# Patient Record
Sex: Male | Born: 1935 | Race: White | Hispanic: No | Smoking: Never smoker
Health system: Southern US, Community
[De-identification: ages and names within clinical notes are randomized; demographics above are authoritative.]

## PROBLEM LIST (undated history)

## (undated) DIAGNOSIS — C801 Malignant (primary) neoplasm, unspecified: Secondary | ICD-10-CM

## (undated) DIAGNOSIS — F329 Major depressive disorder, single episode, unspecified: Secondary | ICD-10-CM

## (undated) DIAGNOSIS — I2510 Atherosclerotic heart disease of native coronary artery without angina pectoris: Secondary | ICD-10-CM

## (undated) DIAGNOSIS — R110 Nausea: Secondary | ICD-10-CM

## (undated) DIAGNOSIS — N189 Chronic kidney disease, unspecified: Secondary | ICD-10-CM

## (undated) DIAGNOSIS — I639 Cerebral infarction, unspecified: Secondary | ICD-10-CM

## (undated) DIAGNOSIS — M1990 Unspecified osteoarthritis, unspecified site: Secondary | ICD-10-CM

## (undated) DIAGNOSIS — I10 Essential (primary) hypertension: Secondary | ICD-10-CM

## (undated) DIAGNOSIS — F32A Depression, unspecified: Secondary | ICD-10-CM

## (undated) HISTORY — PX: CARDIAC CATHETERIZATION: SHX172

## (undated) HISTORY — PX: TONSILLECTOMY: SUR1361

## (undated) HISTORY — PX: CYSTOSCOPY: SUR368

## (undated) HISTORY — PX: KNEE ARTHROSCOPY: SUR90

---

## 1959-07-12 HISTORY — PX: HYDROCELE EXCISION: SHX482

## 2008-11-10 DIAGNOSIS — I639 Cerebral infarction, unspecified: Secondary | ICD-10-CM

## 2008-11-10 HISTORY — DX: Cerebral infarction, unspecified: I63.9

## 2015-07-17 ENCOUNTER — Other Ambulatory Visit: Payer: Self-pay | Admitting: Physician Assistant

## 2015-07-17 NOTE — H&P (Signed)
TOTAL KNEE ADMISSION H&P  Patient is being admitted for left total knee arthroplasty.  Subjective:  Chief Complaint:left knee pain.  HPI: Craig Hoover, 81 y.o. male, has a history of pain and functional disability in the left knee due to arthritis and has failed non-surgical conservative treatments for greater than 12 weeks to includeNSAID's and/or analgesics, corticosteriod injections and viscosupplementation injections.  Onset of symptoms was gradual, starting 5 years ago with rapidlly worsening course since that time. The patient noted no past surgery on the left knee(s).  Patient currently rates pain in the left knee(s) at 6 out of 10 with activity. Patient has night pain, worsening of pain with activity and weight bearing, pain that interferes with activities of daily living and crepitus.  Patient has evidence of subchondral sclerosis and joint space narrowing by imaging studies. There is no active infection.  There are no active problems to display for this patient.  No past medical history on file.  No past surgical history on file.   (Not in a hospital admission) Allergies not on file  Social History  Substance Use Topics  . Smoking status: Not on file  . Smokeless tobacco: Not on file  . Alcohol Use: Not on file    No family history on file.   Review of Systems  Constitutional: Negative.   HENT: Negative.   Eyes: Negative.   Respiratory: Negative.   Cardiovascular: Negative.   Gastrointestinal: Negative.   Genitourinary: Negative.   Musculoskeletal: Positive for joint pain.  Skin: Negative.   Neurological: Negative.   Endo/Heme/Allergies: Negative.   Psychiatric/Behavioral: Negative.     Objective:  Physical Exam  Constitutional: He is oriented to person, place, and time. He appears well-developed and well-nourished.  HENT:  Head: Normocephalic and atraumatic.  Eyes: EOM are normal. Pupils are equal, round, and reactive to light.  Neck: Normal range of motion.  Neck supple.  Cardiovascular: Normal rate and regular rhythm.   Respiratory: Effort normal and breath sounds normal.  GI: Soft. Bowel sounds are normal.  Musculoskeletal:  Alert and oriented x 3.  Examination of his left knee reveals range of motion from 2-90 degrees.  Marked patellofemoral crepitus.  No joint line tenderness.  Examination of his right knee reveals range of motion from 0-120 degrees.  Marked patellofemoral crepitus.  Lateral joint line tenderness to palpation.  He is neurovascularly intact distally.    Neurological: He is alert and oriented to person, place, and time.  Skin: Skin is warm and dry.  Psychiatric: He has a normal mood and affect. His behavior is normal. Judgment and thought content normal.    Vital signs in last 24 hours: @VSRANGES @  Labs:   There is no height or weight on file to calculate BMI.   Imaging Review Plain radiographs demonstrate severe degenerative joint disease of the left knee(s). The overall alignment ismild varus. The bone quality appears to be fair for age and reported activity level.  Assessment/Plan:  End stage arthritis, left knee   The patient history, physical examination, clinical judgment of the provider and imaging studies are consistent with end stage degenerative joint disease of the left knee(s) and total knee arthroplasty is deemed medically necessary. The treatment options including medical management, injection therapy arthroscopy and arthroplasty were discussed at length. The risks and benefits of total knee arthroplasty were presented and reviewed. The risks due to aseptic loosening, infection, stiffness, patella tracking problems, thromboembolic complications and other imponderables were discussed. The patient acknowledged the explanation, agreed to  proceed with the plan and consent was signed. Patient is being admitted for inpatient treatment for surgery, pain control, PT, OT, prophylactic antibiotics, VTE prophylaxis,  progressive ambulation and ADL's and discharge planning. The patient is planning to be discharged home with home health services

## 2015-07-20 ENCOUNTER — Encounter (HOSPITAL_COMMUNITY): Payer: Self-pay

## 2015-07-20 ENCOUNTER — Encounter (HOSPITAL_COMMUNITY)
Admission: RE | Admit: 2015-07-20 | Discharge: 2015-07-20 | Disposition: A | Discharge status: Home / Self Care | Payer: BLUE CROSS/BLUE SHIELD | Source: Ambulatory Visit | Attending: Orthopedic Surgery | Admitting: Orthopedic Surgery

## 2015-07-20 DIAGNOSIS — Z01812 Encounter for preprocedural laboratory examination: Secondary | ICD-10-CM | POA: Diagnosis not present

## 2015-07-20 DIAGNOSIS — M1712 Unilateral primary osteoarthritis, left knee: Secondary | ICD-10-CM | POA: Insufficient documentation

## 2015-07-20 DIAGNOSIS — Z0183 Encounter for blood typing: Secondary | ICD-10-CM | POA: Insufficient documentation

## 2015-07-20 HISTORY — DX: Chronic kidney disease, unspecified: N18.9

## 2015-07-20 HISTORY — DX: Atherosclerotic heart disease of native coronary artery without angina pectoris: I25.10

## 2015-07-20 HISTORY — DX: Unspecified osteoarthritis, unspecified site: M19.90

## 2015-07-20 HISTORY — DX: Nausea: R11.0

## 2015-07-20 HISTORY — DX: Cerebral infarction, unspecified: I63.9

## 2015-07-20 HISTORY — DX: Depression, unspecified: F32.A

## 2015-07-20 HISTORY — DX: Major depressive disorder, single episode, unspecified: F32.9

## 2015-07-20 HISTORY — DX: Essential (primary) hypertension: I10

## 2015-07-20 HISTORY — DX: Malignant (primary) neoplasm, unspecified: C80.1

## 2015-07-20 LAB — COMPREHENSIVE METABOLIC PANEL
ALT: 15 U/L — ABNORMAL LOW (ref 17–63)
AST: 29 U/L (ref 15–41)
Albumin: 3.9 g/dL (ref 3.5–5.0)
Alkaline Phosphatase: 93 U/L (ref 38–126)
Anion gap: 7 (ref 5–15)
BILIRUBIN TOTAL: 0.9 mg/dL (ref 0.3–1.2)
BUN: 15 mg/dL (ref 6–20)
CO2: 27 mmol/L (ref 22–32)
Calcium: 9 mg/dL (ref 8.9–10.3)
Chloride: 102 mmol/L (ref 101–111)
Creatinine, Ser: 0.85 mg/dL (ref 0.61–1.24)
Glucose, Bld: 96 mg/dL (ref 65–99)
POTASSIUM: 4.1 mmol/L (ref 3.5–5.1)
Sodium: 136 mmol/L (ref 135–145)
TOTAL PROTEIN: 6.7 g/dL (ref 6.5–8.1)

## 2015-07-20 LAB — CBC WITH DIFFERENTIAL/PLATELET
BASOS ABS: 0 10*3/uL (ref 0.0–0.1)
Basophils Relative: 1 % (ref 0–1)
Eosinophils Absolute: 0.2 10*3/uL (ref 0.0–0.7)
Eosinophils Relative: 3 % (ref 0–5)
HEMATOCRIT: 41 % (ref 39.0–52.0)
Hemoglobin: 13.3 g/dL (ref 13.0–17.0)
LYMPHS ABS: 1.4 10*3/uL (ref 0.7–4.0)
LYMPHS PCT: 21 % (ref 12–46)
MCH: 28.9 pg (ref 26.0–34.0)
MCHC: 32.4 g/dL (ref 30.0–36.0)
MCV: 88.9 fL (ref 78.0–100.0)
MONO ABS: 0.4 10*3/uL (ref 0.1–1.0)
MONOS PCT: 6 % (ref 3–12)
NEUTROS ABS: 4.4 10*3/uL (ref 1.7–7.7)
Neutrophils Relative %: 69 % (ref 43–77)
Platelets: 259 10*3/uL (ref 150–400)
RBC: 4.61 MIL/uL (ref 4.22–5.81)
RDW: 13.7 % (ref 11.5–15.5)
WBC: 6.3 10*3/uL (ref 4.0–10.5)

## 2015-07-20 LAB — TYPE AND SCREEN
ABO/RH(D): O POS
ANTIBODY SCREEN: NEGATIVE

## 2015-07-20 LAB — APTT: aPTT: 31 seconds (ref 24–37)

## 2015-07-20 LAB — SURGICAL PCR SCREEN
MRSA, PCR: NEGATIVE
Staphylococcus aureus: NEGATIVE

## 2015-07-20 LAB — PROTIME-INR
INR: 1.02 (ref 0.00–1.49)
PROTHROMBIN TIME: 13.6 s (ref 11.6–15.2)

## 2015-07-20 LAB — ABO/RH: ABO/RH(D): O POS

## 2015-07-20 NOTE — Progress Notes (Signed)
Via fax requested records from Dr.J. Kathlen Mody, hoping to get recent EKG.

## 2015-07-20 NOTE — Pre-Procedure Instructions (Signed)
Craig Hoover  07/20/2015      [facility] [address], [place] - [place], [id number] [place] [address] Phone: [phone] Fax: [phone]    Your procedure is scheduled on 08/01/2015   Report to [facility] Admitting at 9:30 A.M.  Call this number if you have problems the morning of surgery:  [phone]   Remember:  Do not eat food or drink liquids after midnight.  On Tuesday   Take these medicines the morning of surgery with A SIP OF WATER: PAROXETINE   Do not wear jewelry   Do not wear lotions, powders, or perfumes.  You may wear deodorant.    Men may shave face and neck.   Do not bring valuables to the hospital.   [facility] is not responsible for any belongings or valuables.  Contacts, dentures or bridgework may not be worn into surgery.  Leave your suitcase in the car.  After surgery it may be brought to your room.  For patients admitted to the hospital, discharge time will be determined by your treatment team.  Patients discharged the day of surgery will not be allowed to drive home.   Name and phone number of your driver:   With daughter Special instructions:  Special Instructions: [place] - Preparing for Surgery  Before surgery, you can play an important role.  Because skin is not sterile, your skin needs to be as free of germs as possible.  You can reduce the number of germs on you skin by washing with CHG (chlorahexidine gluconate) soap before surgery.  CHG is an antiseptic cleaner which kills germs and bonds with the skin to continue killing germs even after washing.  Please DO NOT use if you have an allergy to CHG or antibacterial soaps.  If your skin becomes reddened/irritated stop using the CHG and inform your nurse when you arrive at Short Stay.  Do not shave (including legs and underarms) for at least 48 hours prior to the first CHG shower.  You may shave your face.  Please follow  these instructions carefully:   1.  Shower with CHG Soap the night before surgery and the  morning of Surgery.  2.  If you choose to wash your hair, wash your hair first as usual with your  normal shampoo.  3.  After you shampoo, rinse your hair and body thoroughly to remove the  Shampoo.  4.  Use CHG as you would any other liquid soap.  You can apply chg directly to the skin and wash gently with scrungie or a clean washcloth.  5.  Apply the CHG Soap to your body ONLY FROM THE NECK DOWN.    Do not use on open wounds or open sores.  Avoid contact with your eyes, ears, mouth and genitals (private parts).  Wash genitals (private parts)   with your normal soap.  6.  Wash thoroughly, paying special attention to the area where your surgery will be performed.  7.  Thoroughly rinse your body with warm water from the neck down.  8.  DO NOT shower/wash with your normal soap after using and rinsing off   the CHG Soap.  9.  Pat yourself dry with a clean towel.            10.  Wear clean pajamas.            11.  Place clean sheets on your bed the  night of your first shower and do not sleep with pets.  Day of Surgery  Do not apply any lotions/deodorants the morning of surgery.  Please wear clean clothes to the hospital/surgery center.  Please read over the following fact sheets that you were given. Pain Booklet, Coughing and Deep Breathing, Blood Transfusion Information, Total Joint Packet, MRSA Information and Surgical Site Infection Prevention

## 2015-07-20 NOTE — Progress Notes (Signed)
Call to Jay, Sherian Rein. Tech. Informed that some additions were made to med. List & pt. Had not reported Paroxetine because " didn't think it was important".  Daughter of pt. Is an Therapist, sports, will call back to pharm. On 07/23/2015 if there are anymore additions to be made.

## 2015-07-20 NOTE — Progress Notes (Signed)
Requesting records from Dr. Donnetta Hutching in [facility], stress test & EKG & clearance done recently.

## 2015-07-20 NOTE — Progress Notes (Signed)
Call to Dr. Keturah Barre. Murphy's office, spoke with Benjie Karvonen relative to pt.'s report of allergy to PCN, she will leave msg. For MD/PA to review the preop antibiotic.

## 2015-07-20 NOTE — Progress Notes (Signed)
   07/20/15 1302  OBSTRUCTIVE SLEEP APNEA  Have you ever been diagnosed with sleep apnea through a sleep study? No  Do you snore loudly (loud enough to be heard through closed doors)?  0  Do you often feel tired, fatigued, or sleepy during the daytime (such as falling asleep during driving or talking to someone)? 0  Has anyone observed you stop breathing during your sleep? 0  Do you have, or are you being treated for high blood pressure? 1  BMI more than 35 kg/m2? 0  Age > 50 (1-yes) 1  Neck circumference greater than:Male 16 inches or larger, Male 17inches or larger? 1  Male Gender (Yes=1) 1  Obstructive Sleep Apnea Score 4  Score 5 or greater  Results sent to PCP

## 2015-07-21 LAB — URINE CULTURE: Culture: NO GROWTH

## 2015-07-23 NOTE — Progress Notes (Signed)
Spoke with Carlynn Purl, PA at Dr. Debroah Loop office, states she is aware of allergies but Ancef 2g is okay to give.

## 2015-07-25 ENCOUNTER — Encounter (HOSPITAL_COMMUNITY): Payer: Self-pay

## 2015-07-25 NOTE — Progress Notes (Signed)
Anesthesia Chart Review:  Pt is 81 year old male scheduled for L total knee arthroplasty on 08/01/2015 with Dr. Maryla Morrow.   PCP is Dr. Gwenyth Allegra in [facility]. Cardiologist is Dr. Ricky Ala in [facility].   PMH includes: CAD (by notes, stent to LAD in 2009), stroke (2010), HTN, melanoma. Smoking status not assessed (by notes, former smoker). BMI 28.   Medications include: ASA, lipitor, metoprolol, zantac.   Preoperative labs reviewed.    Chest x-ray report 06/25/2015 reviewed. No active disease.   EKG 06/22/2015: sinus rhythm with rate variation. LAFB.   Nuclear stress test 04/17/2015: There is a fixed defect in the inferior wall, probably due to soft tissue attenuation although cannot exclude prior infarction. No ischemia noted. EF 68%.   Echo 08/24/2010: -Mild concentric LVH -EF 65-70% -No significant valvular disease.   Pt has medical clearance from Dr. Kathlen Mody. Stress test was done by cardiology in anticipation of upcoming surgery.   If no changes, I anticipate pt can proceed with surgery as scheduled.   Willeen Cass, FNP-BC [facility]/Anesthesiology Phone: [phone] 07/25/2015 1:57 PM

## 2015-07-31 MED ORDER — CEFAZOLIN SODIUM-DEXTROSE 2-3 GM-% IV SOLR: 2.0000 g | INTRAVENOUS | Status: DC

## 2015-07-31 MED ORDER — VANCOMYCIN HCL IN DEXTROSE 1-5 GM/200ML-% IV SOLN
1000.0000 mg | INTRAVENOUS | Status: AC
  Administered 2015-08-01: 1000 mg via INTRAVENOUS
  Filled 2015-07-31: qty 200

## 2015-08-01 ENCOUNTER — Encounter (HOSPITAL_COMMUNITY): Payer: Self-pay | Admitting: General Practice

## 2015-08-01 ENCOUNTER — Inpatient Hospital Stay (HOSPITAL_COMMUNITY): Payer: BLUE CROSS/BLUE SHIELD

## 2015-08-01 ENCOUNTER — Inpatient Hospital Stay (HOSPITAL_COMMUNITY)
Admission: RE | Admit: 2015-08-01 | Discharge: 2015-08-03 | DRG: 470 | Disposition: A | Discharge status: Home Health | Payer: BLUE CROSS/BLUE SHIELD | Source: Ambulatory Visit | Attending: Orthopedic Surgery | Admitting: Orthopedic Surgery

## 2015-08-01 ENCOUNTER — Inpatient Hospital Stay (HOSPITAL_COMMUNITY): Payer: BLUE CROSS/BLUE SHIELD | Admitting: Emergency Medicine

## 2015-08-01 ENCOUNTER — Inpatient Hospital Stay (HOSPITAL_COMMUNITY): Payer: BLUE CROSS/BLUE SHIELD | Admitting: Anesthesiology

## 2015-08-01 ENCOUNTER — Encounter (HOSPITAL_COMMUNITY)
Admission: RE | Disposition: A | Discharge status: Home Health | Payer: Self-pay | Source: Ambulatory Visit | Attending: Orthopedic Surgery

## 2015-08-01 DIAGNOSIS — Z8673 Personal history of transient ischemic attack (TIA), and cerebral infarction without residual deficits: Secondary | ICD-10-CM

## 2015-08-01 DIAGNOSIS — I2510 Atherosclerotic heart disease of native coronary artery without angina pectoris: Secondary | ICD-10-CM | POA: Diagnosis present

## 2015-08-01 DIAGNOSIS — M1710 Unilateral primary osteoarthritis, unspecified knee: Secondary | ICD-10-CM | POA: Diagnosis present

## 2015-08-01 DIAGNOSIS — I10 Essential (primary) hypertension: Secondary | ICD-10-CM | POA: Diagnosis present

## 2015-08-01 DIAGNOSIS — D62 Acute posthemorrhagic anemia: Secondary | ICD-10-CM | POA: Diagnosis not present

## 2015-08-01 DIAGNOSIS — F329 Major depressive disorder, single episode, unspecified: Secondary | ICD-10-CM | POA: Diagnosis present

## 2015-08-01 DIAGNOSIS — M25562 Pain in left knee: Secondary | ICD-10-CM | POA: Diagnosis present

## 2015-08-01 DIAGNOSIS — M1712 Unilateral primary osteoarthritis, left knee: Principal | ICD-10-CM | POA: Diagnosis present

## 2015-08-01 DIAGNOSIS — R110 Nausea: Secondary | ICD-10-CM | POA: Diagnosis not present

## 2015-08-01 DIAGNOSIS — M179 Osteoarthritis of knee, unspecified: Secondary | ICD-10-CM | POA: Diagnosis present

## 2015-08-01 DIAGNOSIS — Z96659 Presence of unspecified artificial knee joint: Secondary | ICD-10-CM

## 2015-08-01 HISTORY — PX: TOTAL KNEE ARTHROPLASTY: SHX125

## 2015-08-01 SURGERY — ARTHROPLASTY, KNEE, TOTAL
Anesthesia: General | Laterality: Left

## 2015-08-01 MED ORDER — ZOLPIDEM TARTRATE 5 MG PO TABS: 5.0000 mg | ORAL_TABLET | Freq: Every evening | ORAL | Status: DC | PRN

## 2015-08-01 MED ORDER — BUPIVACAINE LIPOSOME 1.3 % IJ SUSP
20.0000 mL | INTRAMUSCULAR | Status: DC
  Filled 2015-08-01: qty 20

## 2015-08-01 MED ORDER — ONDANSETRON HCL 4 MG/2ML IJ SOLN
4.0000 mg | Freq: Four times a day (QID) | INTRAMUSCULAR | Status: DC | PRN
Start: 2015-08-01 — End: 2015-08-03
  Administered 2015-08-01: 4 mg via INTRAVENOUS
  Filled 2015-08-01: qty 2

## 2015-08-01 MED ORDER — ROCURONIUM BROMIDE 50 MG/5ML IV SOLN
INTRAVENOUS | Status: AC
  Filled 2015-08-01: qty 1

## 2015-08-01 MED ORDER — BUPIVACAINE HCL 0.5 % IJ SOLN
INTRAMUSCULAR | Status: DC | PRN
  Administered 2015-08-01: 10 mL

## 2015-08-01 MED ORDER — APIXABAN 2.5 MG PO TABS
2.5000 mg | ORAL_TABLET | Freq: Two times a day (BID) | ORAL | Status: DC
  Administered 2015-08-02 – 2015-08-03 (×3): 2.5 mg via ORAL
  Filled 2015-08-01 (×3): qty 1

## 2015-08-01 MED ORDER — POTASSIUM CHLORIDE IN NACL 20-0.9 MEQ/L-% IV SOLN
INTRAVENOUS | Status: DC
  Administered 2015-08-01: 16:00:00 via INTRAVENOUS
  Filled 2015-08-01 (×2): qty 1000

## 2015-08-01 MED ORDER — DEXAMETHASONE SODIUM PHOSPHATE 10 MG/ML IJ SOLN
10.0000 mg | Freq: Once | INTRAMUSCULAR | Status: AC
  Administered 2015-08-02: 10 mg via INTRAVENOUS
  Filled 2015-08-01: qty 1

## 2015-08-01 MED ORDER — CELECOXIB 200 MG PO CAPS
200.0000 mg | ORAL_CAPSULE | Freq: Two times a day (BID) | ORAL | Status: DC
  Administered 2015-08-02 – 2015-08-03 (×2): 200 mg via ORAL
  Filled 2015-08-01 (×5): qty 1

## 2015-08-01 MED ORDER — ATORVASTATIN CALCIUM 40 MG PO TABS
40.0000 mg | ORAL_TABLET | Freq: Every day | ORAL | Status: DC
  Administered 2015-08-01 – 2015-08-02 (×2): 40 mg via ORAL
  Filled 2015-08-01 (×2): qty 1

## 2015-08-01 MED ORDER — METHOCARBAMOL 1000 MG/10ML IJ SOLN
500.0000 mg | Freq: Four times a day (QID) | INTRAMUSCULAR | Status: DC | PRN
  Filled 2015-08-01: qty 5

## 2015-08-01 MED ORDER — FENTANYL CITRATE (PF) 250 MCG/5ML IJ SOLN
INTRAMUSCULAR | Status: AC
  Filled 2015-08-01: qty 5

## 2015-08-01 MED ORDER — FINASTERIDE 5 MG PO TABS
5.0000 mg | ORAL_TABLET | Freq: Every day | ORAL | Status: DC
  Administered 2015-08-01 – 2015-08-02 (×2): 5 mg via ORAL
  Filled 2015-08-01 (×4): qty 1

## 2015-08-01 MED ORDER — METOPROLOL SUCCINATE ER 25 MG PO TB24
25.0000 mg | ORAL_TABLET | Freq: Every day | ORAL | Status: DC
  Administered 2015-08-01 – 2015-08-02 (×2): 25 mg via ORAL
  Filled 2015-08-01 (×2): qty 1

## 2015-08-01 MED ORDER — CHLORHEXIDINE GLUCONATE 4 % EX LIQD: 60.0000 mL | Freq: Once | CUTANEOUS | Status: DC

## 2015-08-01 MED ORDER — BUPIVACAINE LIPOSOME 1.3 % IJ SUSP
INTRAMUSCULAR | Status: DC | PRN
  Administered 2015-08-01: 20 mL

## 2015-08-01 MED ORDER — DIPHENHYDRAMINE HCL 12.5 MG/5ML PO ELIX: 12.5000 mg | ORAL_SOLUTION | ORAL | Status: DC | PRN

## 2015-08-01 MED ORDER — LIDOCAINE HCL (CARDIAC) 20 MG/ML IV SOLN
INTRAVENOUS | Status: DC | PRN
  Administered 2015-08-01: 60 mg via INTRAVENOUS

## 2015-08-01 MED ORDER — PAROXETINE HCL 20 MG PO TABS
20.0000 mg | ORAL_TABLET | Freq: Every day | ORAL | Status: DC
  Administered 2015-08-02 – 2015-08-03 (×2): 20 mg via ORAL
  Filled 2015-08-01 (×2): qty 1

## 2015-08-01 MED ORDER — LIDOCAINE HCL (CARDIAC) 20 MG/ML IV SOLN
INTRAVENOUS | Status: AC
  Filled 2015-08-01: qty 5

## 2015-08-01 MED ORDER — HYDROMORPHONE HCL 2 MG PO TABS
2.0000 mg | ORAL_TABLET | ORAL | Status: DC | PRN
  Administered 2015-08-01 – 2015-08-03 (×8): 2 mg via ORAL
  Filled 2015-08-01 (×8): qty 1

## 2015-08-01 MED ORDER — BISACODYL 5 MG PO TBEC: 5.0000 mg | DELAYED_RELEASE_TABLET | Freq: Every day | ORAL | Status: DC | PRN

## 2015-08-01 MED ORDER — HYDROMORPHONE HCL 1 MG/ML IJ SOLN
INTRAMUSCULAR | Status: AC
  Filled 2015-08-01: qty 1

## 2015-08-01 MED ORDER — VANCOMYCIN HCL IN DEXTROSE 1-5 GM/200ML-% IV SOLN
1000.0000 mg | Freq: Two times a day (BID) | INTRAVENOUS | Status: AC
  Administered 2015-08-01: 1000 mg via INTRAVENOUS
  Filled 2015-08-01: qty 200

## 2015-08-01 MED ORDER — BUPIVACAINE HCL (PF) 0.5 % IJ SOLN
INTRAMUSCULAR | Status: AC
  Filled 2015-08-01: qty 10

## 2015-08-01 MED ORDER — MENTHOL 3 MG MT LOZG: 1.0000 | LOZENGE | OROMUCOSAL | Status: DC | PRN

## 2015-08-01 MED ORDER — ONDANSETRON HCL 4 MG/2ML IJ SOLN
INTRAMUSCULAR | Status: AC
  Filled 2015-08-01: qty 4

## 2015-08-01 MED ORDER — APIXABAN 2.5 MG PO TABS: ORAL_TABLET | ORAL | Status: AC

## 2015-08-01 MED ORDER — LACTATED RINGERS IV SOLN
INTRAVENOUS | Status: DC
  Administered 2015-08-01 (×3): via INTRAVENOUS

## 2015-08-01 MED ORDER — FAMOTIDINE 20 MG PO TABS
20.0000 mg | ORAL_TABLET | Freq: Two times a day (BID) | ORAL | Status: DC
  Administered 2015-08-01 – 2015-08-03 (×4): 20 mg via ORAL
  Filled 2015-08-01 (×4): qty 1

## 2015-08-01 MED ORDER — ONDANSETRON HCL 4 MG PO TABS: 4.0000 mg | ORAL_TABLET | Freq: Four times a day (QID) | ORAL | Status: DC | PRN

## 2015-08-01 MED ORDER — ARTIFICIAL TEARS OP OINT
TOPICAL_OINTMENT | OPHTHALMIC | Status: AC
  Filled 2015-08-01: qty 3.5

## 2015-08-01 MED ORDER — EPHEDRINE SULFATE 50 MG/ML IJ SOLN
INTRAMUSCULAR | Status: AC
  Filled 2015-08-01: qty 1

## 2015-08-01 MED ORDER — METHOCARBAMOL 500 MG PO TABS
500.0000 mg | ORAL_TABLET | Freq: Four times a day (QID) | ORAL | Status: DC | PRN
  Administered 2015-08-01 – 2015-08-03 (×6): 500 mg via ORAL
  Filled 2015-08-01 (×6): qty 1

## 2015-08-01 MED ORDER — SODIUM CHLORIDE 0.9 % IJ SOLN
INTRAMUSCULAR | Status: DC | PRN
  Administered 2015-08-01: 40 mL

## 2015-08-01 MED ORDER — SODIUM CHLORIDE 0.9 % IJ SOLN
INTRAMUSCULAR | Status: AC
  Filled 2015-08-01: qty 10

## 2015-08-01 MED ORDER — PROPOFOL 10 MG/ML IV BOLUS
INTRAVENOUS | Status: DC | PRN
  Administered 2015-08-01: 150 mg via INTRAVENOUS
  Administered 2015-08-01: 50 mg via INTRAVENOUS

## 2015-08-01 MED ORDER — GLYCOPYRROLATE 0.2 MG/ML IJ SOLN
INTRAMUSCULAR | Status: DC | PRN
  Administered 2015-08-01: 0.2 mg via INTRAVENOUS

## 2015-08-01 MED ORDER — METOCLOPRAMIDE HCL 5 MG PO TABS: 5.0000 mg | ORAL_TABLET | Freq: Three times a day (TID) | ORAL | Status: DC | PRN

## 2015-08-01 MED ORDER — HYDROMORPHONE HCL 1 MG/ML IJ SOLN
0.2500 mg | INTRAMUSCULAR | Status: DC | PRN
  Administered 2015-08-01 (×2): 0.5 mg via INTRAVENOUS

## 2015-08-01 MED ORDER — DOCUSATE SODIUM 100 MG PO CAPS
100.0000 mg | ORAL_CAPSULE | Freq: Two times a day (BID) | ORAL | Status: DC
  Administered 2015-08-01 – 2015-08-03 (×5): 100 mg via ORAL
  Filled 2015-08-01 (×5): qty 1

## 2015-08-01 MED ORDER — ONDANSETRON HCL 4 MG PO TABS: 4.0000 mg | ORAL_TABLET | Freq: Three times a day (TID) | ORAL | Status: AC | PRN

## 2015-08-01 MED ORDER — ACETAMINOPHEN 650 MG RE SUPP: 650.0000 mg | Freq: Four times a day (QID) | RECTAL | Status: DC | PRN

## 2015-08-01 MED ORDER — ACETAMINOPHEN 325 MG PO TABS
650.0000 mg | ORAL_TABLET | Freq: Four times a day (QID) | ORAL | Status: DC | PRN
  Administered 2015-08-02: 650 mg via ORAL
  Filled 2015-08-01: qty 2

## 2015-08-01 MED ORDER — MAGNESIUM CITRATE PO SOLN: 1.0000 | Freq: Once | ORAL | Status: DC | PRN

## 2015-08-01 MED ORDER — EPHEDRINE SULFATE 50 MG/ML IJ SOLN
INTRAMUSCULAR | Status: DC | PRN
  Administered 2015-08-01: 10 mg via INTRAVENOUS

## 2015-08-01 MED ORDER — SENNOSIDES-DOCUSATE SODIUM 8.6-50 MG PO TABS: 1.0000 | ORAL_TABLET | Freq: Every evening | ORAL | Status: DC | PRN

## 2015-08-01 MED ORDER — PHENOL 1.4 % MT LIQD: 1.0000 | OROMUCOSAL | Status: DC | PRN

## 2015-08-01 MED ORDER — METOCLOPRAMIDE HCL 5 MG/ML IJ SOLN
5.0000 mg | Freq: Three times a day (TID) | INTRAMUSCULAR | Status: DC | PRN
  Administered 2015-08-01: 10 mg via INTRAVENOUS
  Filled 2015-08-01: qty 2

## 2015-08-01 MED ORDER — HYDROMORPHONE HCL 2 MG PO TABS: ORAL_TABLET | ORAL | Status: AC

## 2015-08-01 MED ORDER — FENTANYL CITRATE (PF) 100 MCG/2ML IJ SOLN
INTRAMUSCULAR | Status: DC | PRN
  Administered 2015-08-01 (×10): 50 ug via INTRAVENOUS

## 2015-08-01 MED ORDER — PHENYLEPHRINE HCL 10 MG/ML IJ SOLN
INTRAMUSCULAR | Status: DC | PRN
  Administered 2015-08-01 (×2): 40 ug via INTRAVENOUS

## 2015-08-01 MED ORDER — SODIUM CHLORIDE 0.9 % IR SOLN
Status: DC | PRN
  Administered 2015-08-01: 3000 mL

## 2015-08-01 MED ORDER — HYDROMORPHONE HCL 1 MG/ML IJ SOLN
0.5000 mg | INTRAMUSCULAR | Status: DC | PRN
  Administered 2015-08-01 – 2015-08-02 (×3): 1 mg via INTRAVENOUS
  Administered 2015-08-02: 0.5 mg via INTRAVENOUS
  Administered 2015-08-02: 1 mg via INTRAVENOUS
  Filled 2015-08-01 (×6): qty 1

## 2015-08-01 MED ORDER — ONDANSETRON HCL 4 MG/2ML IJ SOLN
INTRAMUSCULAR | Status: DC | PRN
  Administered 2015-08-01: 4 mg via INTRAVENOUS

## 2015-08-01 MED ORDER — BISACODYL 5 MG PO TBEC: 5.0000 mg | DELAYED_RELEASE_TABLET | Freq: Every day | ORAL | Status: AC | PRN

## 2015-08-01 MED ORDER — AMITRIPTYLINE HCL 25 MG PO TABS
25.0000 mg | ORAL_TABLET | Freq: Every day | ORAL | Status: DC
  Administered 2015-08-01 – 2015-08-02 (×2): 25 mg via ORAL
  Filled 2015-08-01 (×2): qty 1

## 2015-08-01 MED ORDER — PHENYLEPHRINE HCL 10 MG/ML IJ SOLN
INTRAMUSCULAR | Status: AC
  Filled 2015-08-01: qty 1

## 2015-08-01 SURGICAL SUPPLY — 66 items
BANDAGE ELASTIC 4 VELCRO ST LF (GAUZE/BANDAGES/DRESSINGS) ×3 IMPLANT
BANDAGE ELASTIC 6 VELCRO ST LF (GAUZE/BANDAGES/DRESSINGS) ×3 IMPLANT
BANDAGE ESMARK 6X9 LF (GAUZE/BANDAGES/DRESSINGS) ×1 IMPLANT
BENZOIN TINCTURE PRP APPL 2/3 (GAUZE/BANDAGES/DRESSINGS) ×3 IMPLANT
BLADE SAG 18X100X1.27 (BLADE) ×6 IMPLANT
BNDG ESMARK 6X9 LF (GAUZE/BANDAGES/DRESSINGS) ×3
BOWL SMART MIX CTS (DISPOSABLE) ×3 IMPLANT
CAPT KNEE TOTAL 3 ×3 IMPLANT
CEMENT BONE SIMPLEX SPEEDSET (Cement) ×6 IMPLANT
CLOSURE WOUND 1/2 X4 (GAUZE/BANDAGES/DRESSINGS) ×1
COVER SURGICAL LIGHT HANDLE (MISCELLANEOUS) ×3 IMPLANT
CUFF TOURNIQUET SINGLE 34IN LL (TOURNIQUET CUFF) ×3 IMPLANT
DRAPE EXTREMITY T 121X128X90 (DRAPE) ×3 IMPLANT
DRAPE IMP U-DRAPE 54X76 (DRAPES) ×3 IMPLANT
DRAPE PROXIMA HALF (DRAPES) ×3 IMPLANT
DRAPE U-SHAPE 47X51 STRL (DRAPES) ×3 IMPLANT
DRSG PAD ABDOMINAL 8X10 ST (GAUZE/BANDAGES/DRESSINGS) ×3 IMPLANT
DURAPREP 26ML APPLICATOR (WOUND CARE) ×6 IMPLANT
ELECT CAUTERY BLADE 6.4 (BLADE) ×3 IMPLANT
ELECT REM PT RETURN 9FT ADLT (ELECTROSURGICAL) ×3
ELECTRODE REM PT RTRN 9FT ADLT (ELECTROSURGICAL) ×1 IMPLANT
EVACUATOR 1/8 PVC DRAIN (DRAIN) ×3 IMPLANT
FACESHIELD WRAPAROUND (MASK) ×6 IMPLANT
GAUZE SPONGE 4X4 12PLY STRL (GAUZE/BANDAGES/DRESSINGS) ×3 IMPLANT
GLOVE BIOGEL PI IND STRL 7.0 (GLOVE) ×1 IMPLANT
GLOVE BIOGEL PI INDICATOR 7.0 (GLOVE) ×2
GLOVE ORTHO TXT STRL SZ7.5 (GLOVE) ×3 IMPLANT
GLOVE SURG ORTHO 7.0 STRL STRW (GLOVE) ×3 IMPLANT
GLOVE SURG SS PI 7.5 STRL IVOR (GLOVE) ×3 IMPLANT
GOWN STRL REUS W/ TWL LRG LVL3 (GOWN DISPOSABLE) ×2 IMPLANT
GOWN STRL REUS W/ TWL XL LVL3 (GOWN DISPOSABLE) ×1 IMPLANT
GOWN STRL REUS W/TWL LRG LVL3 (GOWN DISPOSABLE) ×4
GOWN STRL REUS W/TWL XL LVL3 (GOWN DISPOSABLE) ×2
HANDPIECE INTERPULSE COAX TIP (DISPOSABLE) ×2
IMMOBILIZER KNEE 22 UNIV (SOFTGOODS) ×3 IMPLANT
IMMOBILIZER KNEE 24 THIGH 36 (MISCELLANEOUS) IMPLANT
IMMOBILIZER KNEE 24 UNIV (MISCELLANEOUS)
KIT BASIN OR (CUSTOM PROCEDURE TRAY) ×3 IMPLANT
KIT ROOM TURNOVER OR (KITS) ×3 IMPLANT
MANIFOLD NEPTUNE II (INSTRUMENTS) ×3 IMPLANT
NEEDLE 18GX1X1/2 (RX/OR ONLY) (NEEDLE) ×3 IMPLANT
NEEDLE HYPO 25GX1X1/2 BEV (NEEDLE) ×3 IMPLANT
NS IRRIG 1000ML POUR BTL (IV SOLUTION) ×3 IMPLANT
PACK TOTAL JOINT (CUSTOM PROCEDURE TRAY) ×3 IMPLANT
PACK UNIVERSAL I (CUSTOM PROCEDURE TRAY) ×3 IMPLANT
PAD ARMBOARD 7.5X6 YLW CONV (MISCELLANEOUS) ×6 IMPLANT
PAD CAST 4YDX4 CTTN HI CHSV (CAST SUPPLIES) ×1 IMPLANT
PADDING CAST COTTON 4X4 STRL (CAST SUPPLIES) ×2
PADDING CAST COTTON 6X4 STRL (CAST SUPPLIES) ×3 IMPLANT
SET HNDPC FAN SPRY TIP SCT (DISPOSABLE) ×1 IMPLANT
SPONGE GAUZE 4X4 12PLY STER LF (GAUZE/BANDAGES/DRESSINGS) ×3 IMPLANT
STRIP CLOSURE SKIN 1/2X4 (GAUZE/BANDAGES/DRESSINGS) ×2 IMPLANT
SUCTION FRAZIER TIP 10 FR DISP (SUCTIONS) ×3 IMPLANT
SUT MNCRL AB 4-0 PS2 18 (SUTURE) ×3 IMPLANT
SUT VIC AB 0 CT1 27 (SUTURE)
SUT VIC AB 0 CT1 27XBRD ANBCTR (SUTURE) IMPLANT
SUT VIC AB 1 CTX 36 (SUTURE) ×2
SUT VIC AB 1 CTX36XBRD ANBCTR (SUTURE) ×1 IMPLANT
SUT VIC AB 2-0 CT1 27 (SUTURE) ×4
SUT VIC AB 2-0 CT1 TAPERPNT 27 (SUTURE) ×2 IMPLANT
SYR 50ML LL SCALE MARK (SYRINGE) ×3 IMPLANT
SYR CONTROL 10ML LL (SYRINGE) ×3 IMPLANT
TOWEL OR 17X24 6PK STRL BLUE (TOWEL DISPOSABLE) ×3 IMPLANT
TOWEL OR 17X26 10 PK STRL BLUE (TOWEL DISPOSABLE) ×3 IMPLANT
WATER STERILE IRR 1000ML POUR (IV SOLUTION) ×3 IMPLANT
YANKAUER SUCT BULB TIP NO VENT (SUCTIONS) ×3 IMPLANT

## 2015-08-01 NOTE — Discharge Instructions (Signed)
INSTRUCTIONS AFTER JOINT REPLACEMENT   o Remove items at home which could result in a fall. This includes throw rugs or furniture in walking pathways o ICE to the affected joint every three hours while awake for 30 minutes at a time, for at least the first 3-5 days, and then as needed for pain and swelling.  Continue to use ice for pain and swelling. You may notice swelling that will progress down to the foot and ankle.  This is normal after surgery.  Elevate your leg when you are not up walking on it.   o Continue to use the breathing machine you got in the hospital (incentive spirometer) which will help keep your temperature down.  It is common for your temperature to cycle up and down following surgery, especially at night when you are not up moving around and exerting yourself.  The breathing machine keeps your lungs expanded and your temperature down.  BLOOD THINNER: TAKE ELIQUIS AS DIRECTED FOR A TOTAL OF 14 DAYS FOLLOWING SURGERY TO PREVENT BLOOD CLOTS.  ONCE FINISHED WITH THIS, RESUME YOUR REGULAR DOSE OF ASPIRIN.  DIET:  As you were doing prior to hospitalization, we recommend a well-balanced diet.  DRESSING / WOUND CARE / SHOWERING  You may change your dressing 3-5 days after surgery.  Then change the dressing every day with sterile gauze.  Please use good hand washing techniques before changing the dressing.  Do not use any lotions or creams on the incision until instructed by your surgeon. and You may shower 3 days after surgery, but keep the wounds dry during showering.  You may use an occlusive plastic wrap (Press'n Seal for example), NO SOAKING/SUBMERGING IN THE BATHTUB.  If the bandage gets wet, change with a clean dry gauze.  If the incision gets wet, pat the wound dry with a clean towel.  ACTIVITY  o Increase activity slowly as tolerated, but follow the weight bearing instructions below.   o No driving for 6 weeks or until further direction given by your physician.  You cannot  drive while taking narcotics.  o No lifting or carrying greater than 10 lbs. until further directed by your surgeon. o Avoid periods of inactivity such as sitting longer than an hour when not asleep. This helps prevent blood clots.  o You may return to work once you are authorized by your doctor.     WEIGHT BEARING   Weight bearing as tolerated with assist device (walker, cane, etc) as directed, use it as long as suggested by your surgeon or therapist, typically at least 4-6 weeks.   EXERCISES  Results after joint replacement surgery are often greatly improved when you follow the exercise, range of motion and muscle strengthening exercises prescribed by your doctor. Safety measures are also important to protect the joint from further injury. Any time any of these exercises cause you to have increased pain or swelling, decrease what you are doing until you are comfortable again and then slowly increase them. If you have problems or questions, call your caregiver or physical therapist for advice.   Rehabilitation is important following a joint replacement. After just a few days of immobilization, the muscles of the leg can become weakened and shrink (atrophy).  These exercises are designed to build up the tone and strength of the thigh and leg muscles and to improve motion. Often times heat used for twenty to thirty minutes before working out will loosen up your tissues and help with improving the range of  motion but do not use heat for the first two weeks following surgery (sometimes heat can increase post-operative swelling).   These exercises can be done on a training (exercise) mat, on the floor, on a table or on a bed. Use whatever works the best and is most comfortable for you.    Use music or television while you are exercising so that the exercises are a pleasant break in your day. This will make your life better with the exercises acting as a break in your routine that you can look forward  to.   Perform all exercises about fifteen times, three times per day or as directed.  You should exercise both the operative leg and the other leg as well.  Exercises include:    Quad Sets - Tighten up the muscle on the front of the thigh (Quad) and hold for 5-10 seconds.    Straight Leg Raises - With your knee straight (if you were given a brace, keep it on), lift the leg to 60 degrees, hold for 3 seconds, and slowly lower the leg.  Perform this exercise against resistance later as your leg gets stronger.   Leg Slides: Lying on your back, slowly slide your foot toward your buttocks, bending your knee up off the floor (only go as far as is comfortable). Then slowly slide your foot back down until your leg is flat on the floor again.   Angel Wings: Lying on your back spread your legs to the side as far apart as you can without causing discomfort.   Hamstring Strength:  Lying on your back, push your heel against the floor with your leg straight by tightening up the muscles of your buttocks.  Repeat, but this time bend your knee to a comfortable angle, and push your heel against the floor.  You may put a pillow under the heel to make it more comfortable if necessary.   A rehabilitation program following joint replacement surgery can speed recovery and prevent re-injury in the future due to weakened muscles. Contact your doctor or a physical therapist for more information on knee rehabilitation.    CONSTIPATION  Constipation is defined medically as fewer than three stools per week and severe constipation as less than one stool per week.  Even if you have a regular bowel pattern at home, your normal regimen is likely to be disrupted due to multiple reasons following surgery.  Combination of anesthesia, postoperative narcotics, change in appetite and fluid intake all can affect your bowels.   YOU MUST use at least one of the following options; they are listed in order of increasing strength to get  the job done.  They are all available over the counter, and you may need to use some, POSSIBLY even all of these options:    Drink plenty of fluids (prune juice may be helpful) and high fiber foods Colace 100 mg by mouth twice a day  Senokot for constipation as directed and as needed Dulcolax (bisacodyl), take with full glass of water  Miralax (polyethylene glycol) once or twice a day as needed.  If you have tried all these things and are unable to have a bowel movement in the first 3-4 days after surgery call either your surgeon or your primary doctor.    If you experience loose stools or diarrhea, hold the medications until you stool forms back up.  If your symptoms do not get better within 1 week or if they get worse, check with your doctor.  If you experience "the worst abdominal pain ever" or develop nausea or vomiting, please contact the office immediately for further recommendations for treatment.   ITCHING:  If you experience itching with your medications, try taking only a single pain pill, or even half a pain pill at a time.  You can also use Benadryl over the counter for itching or also to help with sleep.   TED HOSE STOCKINGS:  Use stockings on both legs until for at least 2 weeks or as directed by physician office. They may be removed at night for sleeping.  MEDICATIONS:  See your medication summary on the After Visit Summary that nursing will review with you.  You may have some home medications which will be placed on hold until you complete the course of blood thinner medication.  It is important for you to complete the blood thinner medication as prescribed.  PRECAUTIONS:  If you experience chest pain or shortness of breath - call 911 immediately for transfer to the hospital emergency department.   If you develop a fever greater that 101 F, purulent drainage from wound, increased redness or drainage from wound, foul odor from the wound/dressing, or calf pain - CONTACT YOUR  SURGEON.                                                   FOLLOW-UP APPOINTMENTS:  If you do not already have a post-op appointment, please call the office for an appointment to be seen by your surgeon.  Guidelines for how soon to be seen are listed in your After Visit Summary, but are typically between 1-4 weeks after surgery.  OTHER INSTRUCTIONS:   Knee Replacement:  Do not place pillow under knee, focus on keeping the knee straight while resting. CPM instructions: 0-90 degrees, 2 hours in the morning, 2 hours in the afternoon, and 2 hours in the evening. Place foam block, curve side up under heel at all times except when in CPM or when walking.  DO NOT modify, tear, cut, or change the foam block in any way.  MAKE SURE YOU:   Understand these instructions.   Get help right away if you are not doing well or get worse.    Thank you for letting us be a part of your medical care team.  It is a privilege we respect greatly.  We hope these instructions will help you stay on track for a fast and full recovery!

## 2015-08-01 NOTE — Anesthesia Preprocedure Evaluation (Signed)
Anesthesia Evaluation  Patient identified by MRN, date of birth, ID band Patient awake    Reviewed: Allergy & Precautions, H&P , NPO status , Patient's Chart, lab work & pertinent test results, reviewed documented beta blocker date and time   Airway Mallampati: II  TM Distance: >3 FB Neck ROM: Full    Dental no notable dental hx. (+) Partial Lower, Partial Upper, Dental Advisory Given   Pulmonary neg pulmonary ROS,    Pulmonary exam normal breath sounds clear to auscultation       Cardiovascular hypertension, Pt. on medications and Pt. on home beta blockers + CAD and + Cardiac Stents   Rhythm:Regular Rate:Normal     Neuro/Psych Depression TIANo Residual Symptoms    GI/Hepatic negative GI ROS, Neg liver ROS,   Endo/Other  negative endocrine ROS  Renal/GU negative Renal ROS  negative genitourinary   Musculoskeletal  (+) Arthritis , Osteoarthritis,    Abdominal   Peds  Hematology negative hematology ROS (+)   Anesthesia Other Findings   Reproductive/Obstetrics negative OB ROS                             Anesthesia Physical Anesthesia Plan  ASA: III  Anesthesia Plan: MAC and Spinal   Post-op Pain Management:    Induction: Intravenous  Airway Management Planned: Simple Face Mask  Additional Equipment:   Intra-op Plan:   Post-operative Plan:   Informed Consent: I have reviewed the patients History and Physical, chart, labs and discussed the procedure including the risks, benefits and alternatives for the proposed anesthesia with the patient or authorized representative who has indicated his/her understanding and acceptance.   Dental advisory given  Plan Discussed with: CRNA  Anesthesia Plan Comments:         Anesthesia Quick Evaluation

## 2015-08-01 NOTE — Anesthesia Postprocedure Evaluation (Signed)
  Anesthesia Post-op Note  Patient: Craig Hoover  Procedure(s) Performed: Procedure(s): TOTAL LEFT  KNEE ARTHROPLASTY (Left)  Patient Location: PACU  Anesthesia Type:General  Level of Consciousness: awake and alert   Airway and Oxygen Therapy: Patient Spontanous Breathing  Post-op Pain: Controlled  Post-op Assessment: Post-op Vital signs reviewed, Patient's Cardiovascular Status Stable and Respiratory Function Stable  Post-op Vital Signs: Reviewed  Filed Vitals:   08/01/15 1531  BP: 149/81  Pulse: 105  Temp: 36.9 C  Resp: 18    Complications: No apparent anesthesia complications

## 2015-08-01 NOTE — Anesthesia Procedure Notes (Signed)
Procedure Name: LMA Insertion Date/Time: 08/01/2015 11:51 AM Performed by: Scheryl Darter Pre-anesthesia Checklist: Patient identified, Emergency Drugs available, Suction available, Patient being monitored and Timeout performed Patient Re-evaluated:Patient Re-evaluated prior to inductionOxygen Delivery Method: Circle system utilized Preoxygenation: Pre-oxygenation with 100% oxygen Intubation Type: IV induction Ventilation: Mask ventilation without difficulty LMA: LMA inserted LMA Size: 5.0 Number of attempts: 1 Placement Confirmation: positive ETCO2 and breath sounds checked- equal and bilateral Tube secured with: Tape Dental Injury: Teeth and Oropharynx as per pre-operative assessment

## 2015-08-01 NOTE — Discharge Summary (Addendum)
Patient ID: Craig Hoover MRN: [id number] DOB/AGE: [birth year] 81 y.o.  Admit date: 08/01/2015 Discharge date: 08/03/2015  Admission Diagnoses:  Active Problems:   DJD (degenerative joint disease) of knee   Discharge Diagnoses:  Same  Past Medical History  Diagnosis Date  . Hypertension   . Nausea in adult     pt. remarks that he gets nausea easily  . Stroke 2010    TIA, no residue   . Depression   . Chronic kidney disease     renal stones - followed with cystoscopy & stent    . Arthritis     bulging disc, arthritis in his knees    . Cancer     melanoma - - L elbow   . Coronary artery disease     stents 2009    Surgeries: Procedure(s): TOTAL LEFT  KNEE ARTHROPLASTY on 08/01/2015   Consultants:    Discharged Condition: Improved  Hospital Course: Craig Hoover is an 81 y.o. male who was admitted 08/01/2015 for operative treatment of primary localized osteoarthritis left knee. Patient has severe unremitting pain that affects sleep, daily activities, and work/hobbies. After pre-op clearance the patient was taken to the operating room on 08/01/2015 and underwent  Procedure(s): TOTAL LEFT  KNEE ARTHROPLASTY.  Patient with a pre-op Hb of 13.3 developed ABLA on pod#1 with a Hb of 10.8 and 9.3 on pod#2.  Patient is currently stable but we will continue to follow.  Patient was given perioperative antibiotics:      Anti-infectives    Start     Dose/Rate Route Frequency Ordered Stop   08/01/15 2230  vancomycin (VANCOCIN) IVPB 1000 mg/200 mL premix     1,000 mg 200 mL/hr over 60 Minutes Intravenous Every 12 hours 08/01/15 1539 08/01/15 2323   08/01/15 0600  ceFAZolin (ANCEF) IVPB 2 g/50 mL premix  Status:  Discontinued     2 g 100 mL/hr over 30 Minutes Intravenous On call to O.R. 07/31/15 1247 07/31/15 1253   07/31/15 1255  vancomycin (VANCOCIN) IVPB 1000 mg/200 mL premix     1,000 mg 200 mL/hr over 60 Minutes Intravenous 60 min pre-op 07/31/15 1255 08/01/15 1128       Patient  was given sequential compression devices, early ambulation, and chemoprophylaxis to prevent DVT.  Patient benefited maximally from hospital stay and there were no complications.    Recent vital signs:  Patient Vitals for the past 24 hrs:  BP Temp Temp src Pulse Resp SpO2  08/03/15 0509 (!) 115/58 mmHg 98.2 F (36.8 C) Oral 86 16 97 %  08/02/15 2002 126/61 mmHg 98.9 F (37.2 C) Oral 95 16 96 %  08/02/15 1334 122/60 mmHg 99 F (37.2 C) Oral 86 18 95 %     Recent laboratory studies:   Recent Labs  08/02/15 0359 08/03/15 0424  WBC 8.0 10.0  HGB 10.8* 9.3*  HCT 34.4* 28.5*  PLT 264 207  NA 134* 135  K 4.4 4.5  CL 101 101  CO2 27 28  BUN 9 12  CREATININE 0.96 1.00  GLUCOSE 135* 154*  CALCIUM 8.0* 8.3*     Discharge Medications:     Medication List    STOP taking these medications        acetaminophen 500 MG tablet  Commonly known as:  TYLENOL     aspirin 325 MG tablet     FISH OIL PO     GLUCOSAMINE-CHONDROITIN PO      TAKE these medications  amitriptyline 25 MG tablet  Commonly known as:  ELAVIL  Take 25 mg by mouth daily after supper.     apixaban 2.5 MG Tabs tablet  Commonly known as:  ELIQUIS  Take 1 tab po q12 hours x 14 days following surgery to prevent blood clots     atorvastatin 40 MG tablet  Commonly known as:  LIPITOR  Take 40 mg by mouth daily after supper.     bisacodyl 5 MG EC tablet  Commonly known as:  DULCOLAX  Take 1 tablet (5 mg total) by mouth daily as needed for moderate constipation.     finasteride 5 MG tablet  Commonly known as:  PROSCAR  Take 5 mg by mouth daily after supper.     fluticasone 50 MCG/ACT nasal spray  Commonly known as:  FLONASE  Place 1 spray into both nostrils daily as needed for allergies or rhinitis.     HYDROmorphone 2 MG tablet  Commonly known as:  DILAUDID  Take 1-2 tabs po q4-6 hours prn pain     metoprolol succinate 50 MG 24 hr tablet  Commonly known as:  TOPROL-XL  Take 25 mg by mouth  daily after supper. Take with or immediately following a meal.     ondansetron 4 MG tablet  Commonly known as:  ZOFRAN  Take 1 tablet (4 mg total) by mouth every 8 (eight) hours as needed for nausea or vomiting.     PARoxetine 20 MG tablet  Commonly known as:  PAXIL  Take 20 mg by mouth daily after breakfast.     ranitidine 150 MG tablet  Commonly known as:  ZANTAC  Take 150 mg by mouth 2 (two) times daily as needed for heartburn.     zolpidem 10 MG tablet  Commonly known as:  AMBIEN  Take 10 mg by mouth at bedtime as needed for sleep.        Diagnostic Studies: Dg Knee Left Port  08/01/2015   CLINICAL DATA:  Postoperative for total knee replacement.  EXAM: PORTABLE LEFT KNEE - 1-2 VIEW  COMPARISON:  None.  FINDINGS: Left total knee prosthesis in place without periprosthetic fracture or other immediate complicating feature. Expected appearance of gas in the tissues and surgical drain.  IMPRESSION: 1. Total knee prosthesis in place without complicating feature.   Electronically Signed   By: Van Clines M.D.   On: 08/01/2015 14:34    Disposition: Final discharge disposition not confirmed    Follow-up Information    Follow up with Ninetta Lights, MD. Schedule an appointment as soon as possible for a visit in 2 weeks.   Specialty:  Orthopedic Surgery   Contact information:   [address] [place] [address] [phone]        Signed: Fannie Knee 08/03/2015, 7:39 AM

## 2015-08-01 NOTE — H&P (View-Only) (Signed)
TOTAL KNEE ADMISSION H&P  Patient is being admitted for left total knee arthroplasty.  Subjective:  Chief Complaint:left knee pain.  HPI: Craig Hoover, 81 y.o. male, has a history of pain and functional disability in the left knee due to arthritis and has failed non-surgical conservative treatments for greater than 12 weeks to includeNSAID's and/or analgesics, corticosteriod injections and viscosupplementation injections.  Onset of symptoms was gradual, starting 5 years ago with rapidlly worsening course since that time. The patient noted no past surgery on the left knee(s).  Patient currently rates pain in the left knee(s) at 6 out of 10 with activity. Patient has night pain, worsening of pain with activity and weight bearing, pain that interferes with activities of daily living and crepitus.  Patient has evidence of subchondral sclerosis and joint space narrowing by imaging studies. There is no active infection.  There are no active problems to display for this patient.  No past medical history on file.  No past surgical history on file.   (Not in a hospital admission) Allergies not on file  Social History  Substance Use Topics  . Smoking status: Not on file  . Smokeless tobacco: Not on file  . Alcohol Use: Not on file    No family history on file.   Review of Systems  Constitutional: Negative.   HENT: Negative.   Eyes: Negative.   Respiratory: Negative.   Cardiovascular: Negative.   Gastrointestinal: Negative.   Genitourinary: Negative.   Musculoskeletal: Positive for joint pain.  Skin: Negative.   Neurological: Negative.   Endo/Heme/Allergies: Negative.   Psychiatric/Behavioral: Negative.     Objective:  Physical Exam  Constitutional: He is oriented to person, place, and time. He appears well-developed and well-nourished.  HENT:  Head: Normocephalic and atraumatic.  Eyes: EOM are normal. Pupils are equal, round, and reactive to light.  Neck: Normal range of motion.  Neck supple.  Cardiovascular: Normal rate and regular rhythm.   Respiratory: Effort normal and breath sounds normal.  GI: Soft. Bowel sounds are normal.  Musculoskeletal:  Alert and oriented x 3.  Examination of his left knee reveals range of motion from 2-90 degrees.  Marked patellofemoral crepitus.  No joint line tenderness.  Examination of his right knee reveals range of motion from 0-120 degrees.  Marked patellofemoral crepitus.  Lateral joint line tenderness to palpation.  He is neurovascularly intact distally.    Neurological: He is alert and oriented to person, place, and time.  Skin: Skin is warm and dry.  Psychiatric: He has a normal mood and affect. His behavior is normal. Judgment and thought content normal.    Vital signs in last 24 hours: @VSRANGES @  Labs:   There is no height or weight on file to calculate BMI.   Imaging Review Plain radiographs demonstrate severe degenerative joint disease of the left knee(s). The overall alignment ismild varus. The bone quality appears to be fair for age and reported activity level.  Assessment/Plan:  End stage arthritis, left knee   The patient history, physical examination, clinical judgment of the provider and imaging studies are consistent with end stage degenerative joint disease of the left knee(s) and total knee arthroplasty is deemed medically necessary. The treatment options including medical management, injection therapy arthroscopy and arthroplasty were discussed at length. The risks and benefits of total knee arthroplasty were presented and reviewed. The risks due to aseptic loosening, infection, stiffness, patella tracking problems, thromboembolic complications and other imponderables were discussed. The patient acknowledged the explanation, agreed to  proceed with the plan and consent was signed. Patient is being admitted for inpatient treatment for surgery, pain control, PT, OT, prophylactic antibiotics, VTE prophylaxis,  progressive ambulation and ADL's and discharge planning. The patient is planning to be discharged home with home health services

## 2015-08-01 NOTE — Transfer of Care (Signed)
Immediate Anesthesia Transfer of Care Note  Patient: Craig Hoover  Procedure(s) Performed: Procedure(s): TOTAL LEFT  KNEE ARTHROPLASTY (Left)  Patient Location: PACU  Anesthesia Type:General  Level of Consciousness: awake, alert , oriented and sedated  Airway & Oxygen Therapy: Patient Spontanous Breathing, Patient connected to nasal cannula oxygen and Patient connected to face mask oxygen  Post-op Assessment: Report given to RN, Post -op Vital signs reviewed and stable and Patient moving all extremities  Post vital signs: Reviewed and stable  Last Vitals:  Filed Vitals:   08/01/15 0954  BP: 145/65  Pulse: 66  Resp: 20    Complications: No apparent anesthesia complications

## 2015-08-01 NOTE — Interval H&P Note (Signed)
History and Physical Interval Note:  08/01/2015 8:29 AM  Craig Hoover  has presented today for surgery, with the diagnosis of DJD LEFT KNEE  The various methods of treatment have been discussed with the patient and family. After consideration of risks, benefits and other options for treatment, the patient has consented to  Procedure(s): TOTAL LEFT  KNEE ARTHROPLASTY (Left) as a surgical intervention .  The patient's history has been reviewed, patient examined, no change in status, stable for surgery.  I have reviewed the patient's chart and labs.  Questions were answered to the patient's satisfaction.     Ninetta Lights

## 2015-08-01 NOTE — Op Note (Signed)
[name], Craig Hoover NO.:  [id number]  MEDICAL RECORD NO.:  [id number]  LOCATION:  5N02C                        FACILITY:  [facility]  PHYSICIAN:  Ninetta Lights, M.D. DATE OF BIRTH:  02/18/1934  DATE OF PROCEDURE:  08/01/2015 DATE OF DISCHARGE:                              OPERATIVE REPORT   PREOPERATIVE DIAGNOSIS:  End-stage arthritis left knee, primary generalized.  POSTOPERATIVE DIAGNOSIS:  End-stage arthritis left knee, primary generalized.  PROCEDURE:  Left knee modified minimally invasive total knee replacement with Stryker triathlon prosthesis.  Cemented pegged posterior stabilized #5 femoral component.  Cemented #5 tibial component, 9 mm posterior stabilized insert.  Cemented resurfacing 38-mm patellar component.  SURGEON:  Ninetta Lights, M.D.  ASSISTANT:  Elmyra Ricks, PA, present throughout the entire case and necessary for timely completion of procedure.  ANESTHESIA:  General.  BLOOD LOSS:  Minimal.  SPECIMENS:  None.  CULTURES:  None.  COMPLICATIONS:  None.  DRESSINGS:  Soft compressive knee immobilizer.  TOURNIQUET TIME:  1 hour.  DESCRIPTION OF PROCEDURE:  The patient was brought to the operating room, placed on the operating table in supine position.  After adequate anesthesia had been obtained, tourniquet applied.  Prepped and draped in usual sterile fashion.  Exsanguinated with elevation of Esmarch. Tourniquet inflated to 350 mmHg.  A longitudinal incision above the patella down to tibial tubercle.  Medial arthrotomy, vastus splitting, preserving quad tendon.  Distal femur exposed.  Intramedullary guide. An 8-mm resection 5 degrees of valgus.  Using epicondylar axis, this was sized, cut, and fitted for a pegged posterior stabilized #5 component. Proximal tibial resection with extramedullary guide.  A 3-degree posterior slope cut.  Sized to #5 component as well.  Patella exposed. Posterior 11 mm removed.  Drilled, sized,  and fitted for a 38-mm component.  Trials put in place with the 9 mm PS insert.  With this construct, I was very pleased with balancing motion, stability, and patellar tracking.  Tibia was marked for rotation and reamed.  All trials removed.  Copious irrigation with pulse irrigating device. Cement prepared, placed on all components, firmly seated.  Polyethylene attached to the tibia, knee reduced.  Patella held with clamp.  Once cement hardened, knee was irrigated again.  Soft tissue was injected with Exparel.  Hemovac was placed and brought out through a separate stab wound.  Arthrotomy closed with Vicryl with a subcutaneous and subcuticular closure with Vicryl as well.  Margins were injected with Marcaine.  Sterile compressive dressing applied.  Tourniquet deflated and removed.  Knee immobilizer applied.  Anesthesia reversed.  Brought to the recovery room.  Tolerated the surgery well.  No complications.     Ninetta Lights, M.D.     DFM/MEDQ  D:  08/01/2015  T:  08/01/2015  Job:  [id number]

## 2015-08-01 NOTE — Progress Notes (Signed)
Orthopedic Tech Progress Note Patient Details:  Craig Hoover [birth year] [id number]  CPM Left Knee CPM Left Knee: On Left Knee Flexion (Degrees): 90 Left Knee Extension (Degrees): 0 Additional Comments: trapeze bar patient helper Viewed order from doctor's order list  Hildred Priest 08/01/2015, 2:17 PM

## 2015-08-02 ENCOUNTER — Encounter (HOSPITAL_COMMUNITY): Payer: Self-pay | Admitting: Orthopedic Surgery

## 2015-08-02 LAB — CBC
HEMATOCRIT: 34.4 % — AB (ref 39.0–52.0)
HEMOGLOBIN: 10.8 g/dL — AB (ref 13.0–17.0)
MCH: 28.3 pg (ref 26.0–34.0)
MCHC: 31.4 g/dL (ref 30.0–36.0)
MCV: 90.3 fL (ref 78.0–100.0)
Platelets: 264 10*3/uL (ref 150–400)
RBC: 3.81 MIL/uL — AB (ref 4.22–5.81)
RDW: 13.6 % (ref 11.5–15.5)
WBC: 8 10*3/uL (ref 4.0–10.5)

## 2015-08-02 LAB — BASIC METABOLIC PANEL
Anion gap: 6 (ref 5–15)
BUN: 9 mg/dL (ref 6–20)
CHLORIDE: 101 mmol/L (ref 101–111)
CO2: 27 mmol/L (ref 22–32)
Calcium: 8 mg/dL — ABNORMAL LOW (ref 8.9–10.3)
Creatinine, Ser: 0.96 mg/dL (ref 0.61–1.24)
GFR calc Af Amer: >60 mL/min (ref >60)
GFR calc non Af Amer: >60 mL/min (ref >60)
Glucose, Bld: 135 mg/dL — ABNORMAL HIGH (ref 65–99)
POTASSIUM: 4.4 mmol/L (ref 3.5–5.1)
SODIUM: 134 mmol/L — AB (ref 135–145)

## 2015-08-02 NOTE — Progress Notes (Signed)
Utilization review completed. Bertha Stanfill, RN, BSN. 

## 2015-08-02 NOTE — Progress Notes (Signed)
Subjective: 1 Day Post-Op Procedure(s) (LRB): TOTAL LEFT  KNEE ARTHROPLASTY (Left) Patient reports pain as moderate.  Patient reports nausea yesterday following surgery.  This has since resolved.  No lightheadedness/dizziness, chest pain/sob.    Objective: Vital signs in last 24 hours: Temp:  [97.7 F (36.5 C)-100.2 F (37.9 C)] 99.2 F (37.3 C) (09/22 0658) Pulse Rate:  [66-116] 114 (09/22 0658) Resp:  [16-21] 18 (09/22 0658) BP: (123-157)/(65-81) 126/66 mmHg (09/22 0658) SpO2:  [92 %-100 %] 94 % (09/22 0658) Weight:  [87.998 kg (194 lb)] 87.998 kg (194 lb) (09/21 0954)  Intake/Output from previous day: 09/21 0701 - 09/22 0700 In: 2060 [P.O.:260; I.V.:1800] Out: 1100 [Urine:500; Drains:600] Intake/Output this shift:     Recent Labs  08/02/15 0359  HGB 10.8*    Recent Labs  08/02/15 0359  WBC 8.0  RBC 3.81*  HCT 34.4*  PLT 264    Recent Labs  08/02/15 0359  NA 134*  K 4.4  CL 101  CO2 27  BUN 9  CREATININE 0.96  GLUCOSE 135*  CALCIUM 8.0*   No results for input(s): LABPT, INR in the last 72 hours.  Neurologically intact Neurovascular intact Sensation intact distally Intact pulses distally  hemovac drain pulled by me today No drainage noted through dressing Negative homans bilaterally  Assessment/Plan: 1 Day Post-Op Procedure(s) (LRB): TOTAL LEFT  KNEE ARTHROPLASTY (Left) Advance diet Up with therapy Plan for discharge tomorrow home with hhpt WBAT LLE ABLA-mild and stable Dry dressing change prn  Fannie Knee 08/02/2015, 8:43 AM

## 2015-08-02 NOTE — Evaluation (Signed)
Physical Therapy Evaluation Patient Details Name: Craig Hoover MRN: [id number] DOB: 22-Nov-1933 Today's Date: 08/02/2015   History of Present Illness  Patient is a 81 y/o male s/p L TKA. PMH includes HTN, stroke, CKD, CAD.  Clinical Impression  Patient presents with pain and post surgical deficits LLE s/p Left TKA impacting mobility. Pt with increased pain this morning in left knee. Tolerated short distance ambulation with Min A for safety/support. Instructed pt in exercises. Pt plans to discharge home with children support. Will follow acutely to maximize independence and mobility prior to return home. Will plan for gait training in PM session and will provide HEP handout.     Follow Up Recommendations Home health PT;Supervision/Assistance - 24 hour    Equipment Recommendations  None recommended by PT    Recommendations for Other Services OT consult     Precautions / Restrictions Precautions Precautions: Knee Precaution Booklet Issued: No Precaution Comments: Reviewed no pillow under knee and precautions. Restrictions Weight Bearing Restrictions: Yes LLE Weight Bearing: Weight bearing as tolerated      Mobility  Bed Mobility Overal bed mobility: Needs Assistance Bed Mobility: Supine to Sit     Supine to sit: Mod assist;HOB elevated     General bed mobility comments: Step by step cues for technique. Use of rails for support. Assist with trunk and LLE. Increased time and effort due to pain.  Transfers Overall transfer level: Needs assistance Equipment used: Rolling walker (2 wheeled) Transfers: Sit to/from Stand Sit to Stand: Min assist         General transfer comment: Min A to boost from EOB with cues for hand placement and technique. Transferred to chair post ambulation. Cues to reach back for surface.  Ambulation/Gait Ambulation/Gait assistance: Min assist Ambulation Distance (Feet): 20 Feet (x2 bouts) Assistive device: Rolling walker (2 wheeled) Gait  Pattern/deviations: Step-to pattern;Decreased stance time - left;Decreased step length - right;Trunk flexed;Decreased stride length   Gait velocity interpretation: <1.8 ft/sec, indicative of risk for recurrent falls General Gait Details: Slow, unsteady gait with cues for RW proximity/management. Increased pain with WB through LLE. Sp02 decreased to 86% on RA. Donned 1L/min 02.  Stairs            Wheelchair Mobility    Modified Rankin (Stroke Patients Only)       Balance Overall balance assessment: Needs assistance Sitting-balance support: Feet supported;No upper extremity supported Sitting balance-Leahy Scale: Fair     Standing balance support: During functional activity Standing balance-Leahy Scale: Poor Standing balance comment: Reilent on RW for support. Min A for retro gait.                             Pertinent Vitals/Pain Pain Assessment: 0-10 Pain Score: 6  Pain Location: left knee Pain Descriptors / Indicators: Sore;Aching Pain Intervention(s): Limited activity within patient's tolerance;Monitored during session;RN gave pain meds during session;Repositioned    Home Living Family/patient expects to be discharged to:: Private residence Living Arrangements: Alone Available Help at Discharge: Family;Available 24 hours/day (2 daughters and son plan to trade off providing 24/7 S as needed.) Type of Home: House Home Access: Stairs to enter Entrance Stairs-Rails: None Entrance Stairs-Number of Steps: 2+2+1 Home Layout: One level Home Equipment: Environmental consultant - 2 wheels;Bedside commode      Prior Function Level of Independence: Independent         Comments: Pt still works as Librarian, 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  Hand Dominance        Extremity/Trunk Assessment   Upper Extremity Assessment: Defer to OT evaluation           Lower Extremity Assessment: LLE deficits/detail   LLE Deficits / Details: Limited  AROM/strengths econdary to pain and recent surgery.     Communication   Communication: HOH  Cognition Arousal/Alertness: Awake/alert Behavior During Therapy: WFL for tasks assessed/performed Overall Cognitive Status: Within Functional Limits for tasks assessed                      General Comments General comments (skin integrity, edema, etc.): Daughters present during PT evaluation.    Exercises Total Joint Exercises Ankle Circles/Pumps: Both;10 reps;Supine Quad Sets: Both;10 reps;Supine Gluteal Sets: Both;10 reps;Supine Goniometric ROM: 11-80 degrees knee AROM      Assessment/Plan    PT Assessment Patient needs continued PT services  PT Diagnosis Difficulty walking;Acute pain   PT Problem List Decreased strength;Pain;Cardiopulmonary status limiting activity;Decreased range of motion;Decreased activity tolerance;Decreased balance;Decreased mobility;Decreased knowledge of use of DME  PT Treatment Interventions Balance training;Functional mobility training;Therapeutic activities;Therapeutic exercise;Patient/family education;DME instruction;Gait training;Stair training   PT Goals (Current goals can be found in the Care Plan section) Acute Rehab PT Goals Patient Stated Goal: to return to independence PT Goal Formulation: With patient Time For Goal Achievement: 08/16/15 Potential to Achieve Goals: Good    Frequency 7X/week   Barriers to discharge        Co-evaluation               End of Session Equipment Utilized During Treatment: Gait belt Activity Tolerance: Patient limited by pain;Patient tolerated treatment well Patient left: in chair;with call bell/phone within reach;with family/visitor present Nurse Communication: Mobility status         Time: 1700-1749 PT Time Calculation (min) (ACUTE ONLY): 34 min   Charges:   PT Evaluation $Initial PT Evaluation Tier I: 1 Procedure PT Treatments $Therapeutic Activity: 8-22 mins   PT G Codes:         Yaret Hush A Mikahla Wisor 08/02/2015, 10:26 AM Wray Kearns, PT, DPT [phone]

## 2015-08-02 NOTE — Evaluation (Signed)
Occupational Therapy Evaluation Patient Details Name: Craig Hoover MRN: [id number] DOB: [birth year] Today's Date: 08/02/2015    History of Present Illness Patient is a 81 y/o male s/p L TKA. PMH includes HTN, stroke, CKD, CAD.   Clinical Impression   Pt reports he was independent with ADLs PTA. Currently pt is min assist overall for ADLs. Pt planning to d/c home alone with support from family. Family reports they will be able to provide 24 hour supervision/assist for pt as needed. Educated pt and family on compensatory strategies for LB ADLs, RW transfer technique, home safety, and walk in shower transfer technique. Recommending HHOT to maximize pts independence with ADLs and functional mobility. Pt would benefit from skilled OT services in order to increase independence and safety with LB ADLs, walk in shower and toilet transfers.     Follow Up Recommendations  Home health OT;Supervision/Assistance - 24 hour    Equipment Recommendations  None recommended by OT (Family reports pt has 3 in 1 and can get shower seat)    Recommendations for Other Services       Precautions / Restrictions Precautions Precautions: Knee Precaution Comments: Reviewed no pillow under knee and precautions. Restrictions Weight Bearing Restrictions: Yes LLE Weight Bearing: Weight bearing as tolerated      Mobility Bed Mobility               General bed mobility comments: Pt found in recliner, returned to recliner at end of session  Transfers Overall transfer level: Needs assistance Equipment used: Rolling walker (2 wheeled) Transfers: Sit to/from Stand Sit to Stand: Min assist         General transfer comment: Min assist to boost up from chair. Pt with good hand placement and technique.     Balance Overall balance assessment: Needs assistance         Standing balance support: Bilateral upper extremity supported Standing balance-Leahy Scale: Poor Standing balance comment: RW for  support                             ADL Overall ADL's : Needs assistance/impaired Eating/Feeding: Set up;Sitting   Grooming: Supervision/safety;Standing   Upper Body Bathing: Supervision/ safety;Sitting   Lower Body Bathing: Minimal assistance;Sit to/from stand   Upper Body Dressing : Supervision/safety;Sitting   Lower Body Dressing: Minimal assistance;Sit to/from stand Lower Body Dressing Details (indicate cue type and reason): Pt able to reach R foot to don/doff sock, unable to reach L foot but able to reach to ankle Toilet Transfer: Minimal assistance;Ambulation;BSC;RW (BSC over toilet ) Toilet Transfer Details (indicate cue type and reason): Min assist to boost up  [place] and Hygiene: Minimal assistance;Sit to/from stand   Tub/ Shower Transfer: Minimal assistance;Ambulation;Rolling walker   Functional mobility during ADLs: Min guard General ADL Comments: Family (2 daughters and son in Sports coach) present for OT eval. Educated pt and family on compensatory strategies for LB ADLs, RW transfer technique, walk in shower transfer technique, and home safety. Spoke to pt and family about AE for increased independence with LB ADLs, pt and family declined AE and stated that someone could help until pt regains independence.     Vision     Perception     Praxis      Pertinent Vitals/Pain Pain Assessment: Faces Faces Pain Scale: Hurts even more Pain Location: L knee Pain Descriptors / Indicators: Grimacing (when standing) Pain Intervention(s): Limited activity within patient's tolerance;Monitored during session;Repositioned;Ice applied  Hand Dominance Right   Extremity/Trunk Assessment Upper Extremity Assessment Upper Extremity Assessment: Overall WFL for tasks assessed   Lower Extremity Assessment Lower Extremity Assessment: Defer to PT evaluation   Cervical / Trunk Assessment Cervical / Trunk Assessment: Normal   Communication  Communication Communication: HOH   Cognition Arousal/Alertness: Awake/alert Behavior During Therapy: WFL for tasks assessed/performed Overall Cognitive Status: Within Functional Limits for tasks assessed                     General Comments       Exercises       Shoulder Instructions      Home Living Family/patient expects to be discharged to:: Private residence Living Arrangements: Alone Available Help at Discharge: Family;Available 24 hours/day (2 daughters and son plan to trade off providing 24/7 S) Type of Home: House Home Access: Stairs to enter [employer] of Steps: 2+2+1 Entrance Stairs-Rails: None Home Layout: One level     Bathroom Shower/Tub: Occupational psychologist: Standard Bathroom Accessibility: Yes How Accessible: Accessible via walker Home Equipment: [place] - 2 wheels;Bedside commode          Prior Functioning/Environment Level of Independence: Independent        Comments: Pt still works as Librarian, 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     OT Diagnosis: Generalized weakness;Acute pain   OT Problem List: Decreased strength;Decreased activity tolerance;Impaired balance (sitting and/or standing);Decreased safety awareness;Decreased knowledge of use of DME or AE;Decreased knowledge of precautions;Pain   OT Treatment/Interventions: Self-care/ADL training;Energy conservation;DME and/or AE instruction;Patient/family education    OT Goals(Current goals can be found in the care plan section) Acute Rehab OT Goals Patient Stated Goal: to return to independence OT Goal Formulation: With patient Time For Goal Achievement: 08/16/15 Potential to Achieve Goals: Good ADL Goals Pt Will Perform Grooming: with modified independence;standing Pt Will Perform Lower Body Bathing: with supervision;sit to/from stand Pt Will Perform Lower Body Dressing: with supervision;sit to/from stand Pt Will Transfer to Toilet: with  modified independence;ambulating;bedside commode (BSC over toilet) Pt Will Perform Tub/Shower Transfer: with supervision;ambulating;shower seat;rolling walker  OT Frequency: Min 2X/week   Barriers to D/C:            Co-evaluation              End of Session Equipment Utilized During Treatment: Gait belt;Rolling walker CPM Left Knee CPM Left Knee: Off  Activity Tolerance: Patient tolerated treatment well Patient left: in chair;with call bell/phone within reach;with family/visitor present   Time: 5284-1324 OT Time Calculation (min): 23 min Charges:  OT General Charges $OT Visit: 1 Procedure OT Evaluation $Initial OT Evaluation Tier I: 1 Procedure OT Treatments $Self Care/Home Management : 8-22 mins G-Codes:    Binnie Kand M.S., OTR/L [phone]  08/02/2015, 3:00 PM

## 2015-08-02 NOTE — Progress Notes (Signed)
Physical Therapy Treatment Patient Details Name: Craig Hoover MRN: [id number] DOB: 05-Feb-1934 Today's Date: 08/02/2015    History of Present Illness Patient is a 81 y/o male s/p L TKA. PMH includes HTN, stroke, CKD, CAD.    PT Comments    Patient progressing well towards PT goals. Improved ambulation distance this PM with less pain. Provided handout and instructed pt in exercises. Requires repetition for tasks as pt HOH. Will plan for bed mobility, gait training and stair training next session as tolerated to prepare pt for discharge home. Will follow acutely per current POC.   Follow Up Recommendations  Home health PT;Supervision/Assistance - 24 hour     Equipment Recommendations  None recommended by PT    Recommendations for Other Services       Precautions / Restrictions Precautions Precautions: Knee Precaution Booklet Issued: Yes (comment) Precaution Comments: Reviewed no pillow under knee and precautions. Restrictions Weight Bearing Restrictions: Yes LLE Weight Bearing: Weight bearing as tolerated    Mobility  Bed Mobility               General bed mobility comments: Standing in bathroom upon PT arrival.   Transfers Overall transfer level: Needs assistance Equipment used: Rolling walker (2 wheeled) Transfers: Sit to/from Stand Sit to Stand: Min guard         General transfer comment: Min guard for safety. Cues to reach back for arm rests and controlled descent into chair. Stood from Automotive engineer.   Ambulation/Gait Ambulation/Gait assistance: Min guard Ambulation Distance (Feet): 120 Feet Assistive device: Rolling walker (2 wheeled) Gait Pattern/deviations: Decreased stance time - left;Decreased step length - right;Step-through pattern;Step-to pattern;Decreased stride length   Gait velocity interpretation: <1.8 ft/sec, indicative of risk for recurrent falls General Gait Details: Cues for knee extension during stance phase to activate quadriceps. Cues for  step through gait and heel strike for initial contact. Sp02 90% on RA.   Stairs            Wheelchair Mobility    Modified Rankin (Stroke Patients Only)       Balance Overall balance assessment: Needs assistance Sitting-balance support: Feet supported;No upper extremity supported Sitting balance-Leahy Scale: Fair     Standing balance support: During functional activity Standing balance-Leahy Scale: Fair Standing balance comment: Able to stand unsupported for 6 minutes- assisting nurse with IV.                    Cognition Arousal/Alertness: Awake/alert Behavior During Therapy: WFL for tasks assessed/performed Overall Cognitive Status: Within Functional Limits for tasks assessed                      Exercises Total Joint Exercises Heel Slides: Left;10 reps;Seated Hip ABduction/ADduction: Left;10 reps;Seated Long Arc Quad: Left;10 reps;Seated    General Comments General comments (skin integrity, edema, etc.): Family (2 daughters and son in law) present for OT eval      Pertinent Vitals/Pain Pain Assessment: Faces Faces Pain Scale: Hurts little more Pain Location: left knee Pain Descriptors / Indicators: Grimacing;Sore Pain Intervention(s): Monitored during session;Repositioned;Ice applied    Home Living Family/patient expects to be discharged to:: Private residence Living Arrangements: Alone Available Help at Discharge: Family;Available 24 hours/day (2 daughters and son plan to trade off providing 24/7 S) Type of Home: House Home Access: Stairs to enter Entrance Stairs-Rails: None Home Layout: One level Home Equipment: Environmental consultant - 2 wheels;Bedside commode      Prior Function Level of Independence: Independent  Comments: Pt still works as Librarian, 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    PT Goals (current goals can now be found in the care plan section) Acute Rehab PT Goals Patient Stated Goal: to return to  independence Progress towards PT goals: Progressing toward goals    Frequency  7X/week    PT Plan Current plan remains appropriate    Co-evaluation             End of Session Equipment Utilized During Treatment: Gait belt Activity Tolerance: Patient tolerated treatment well Patient left: in chair;with call bell/phone within reach;with family/visitor present;Other (comment) (in zero degree knee.)     Time: 1445-1520 PT Time Calculation (min) (ACUTE ONLY): 35 min  Charges:  $Gait Training: 8-22 mins $Therapeutic Exercise: 8-22 mins                    G Codes:      Shauna A Hartshorne 08/02/2015, 4:27 PM Wray Kearns, [place], DPT [phone]

## 2015-08-02 NOTE — Plan of Care (Signed)
Problem: Consults Goal: Diagnosis- Total Joint Replacement Outcome: Completed/Met Date Met:  08/02/15 Primary Total Knee Left

## 2015-08-03 LAB — BASIC METABOLIC PANEL
ANION GAP: 6 (ref 5–15)
BUN: 12 mg/dL (ref 6–20)
CALCIUM: 8.3 mg/dL — AB (ref 8.9–10.3)
CO2: 28 mmol/L (ref 22–32)
Chloride: 101 mmol/L (ref 101–111)
Creatinine, Ser: 1 mg/dL (ref 0.61–1.24)
GFR calc Af Amer: >60 mL/min (ref >60)
GFR calc non Af Amer: >60 mL/min (ref >60)
GLUCOSE: 154 mg/dL — AB (ref 65–99)
Potassium: 4.5 mmol/L (ref 3.5–5.1)
SODIUM: 135 mmol/L (ref 135–145)

## 2015-08-03 LAB — CBC
HCT: 28.5 % — ABNORMAL LOW (ref 39.0–52.0)
Hemoglobin: 9.3 g/dL — ABNORMAL LOW (ref 13.0–17.0)
MCH: 29.3 pg (ref 26.0–34.0)
MCHC: 32.6 g/dL (ref 30.0–36.0)
MCV: 89.9 fL (ref 78.0–100.0)
PLATELETS: 207 10*3/uL (ref 150–400)
RBC: 3.17 MIL/uL — ABNORMAL LOW (ref 4.22–5.81)
RDW: 13.6 % (ref 11.5–15.5)
WBC: 10 10*3/uL (ref 4.0–10.5)

## 2015-08-03 NOTE — Progress Notes (Signed)
Subjective: 2 Days Post-Op Procedure(s) (LRB): TOTAL LEFT  KNEE ARTHROPLASTY (Left) Patient reports pain as mild.  No nausea/vomiting, lightheadedness/dizziness, chest pain/sob.  Positive flatus, no bm.  Tolerating diet.  Objective: Vital signs in last 24 hours: Temp:  [98.2 F (36.8 C)-99 F (37.2 C)] 98.2 F (36.8 C) (09/23 0509) Pulse Rate:  [86-95] 86 (09/23 0509) Resp:  [16-18] 16 (09/23 0509) BP: (115-126)/(58-61) 115/58 mmHg (09/23 0509) SpO2:  [95 %-97 %] 97 % (09/23 0509)  Intake/Output from previous day: 09/22 0701 - 09/23 0700 In: 840 [P.O.:840] Out: 500 [Urine:500] Intake/Output this shift:     Recent Labs  08/02/15 0359 08/03/15 0424  HGB 10.8* 9.3*    Recent Labs  08/02/15 0359 08/03/15 0424  WBC 8.0 10.0  RBC 3.81* 3.17*  HCT 34.4* 28.5*  PLT 264 207    Recent Labs  08/02/15 0359 08/03/15 0424  NA 134* 135  K 4.4 4.5  CL 101 101  CO2 27 28  BUN 9 12  CREATININE 0.96 1.00  GLUCOSE 135* 154*  CALCIUM 8.0* 8.3*   No results for input(s): LABPT, INR in the last 72 hours.  Neurologically intact Neurovascular intact Sensation intact distally Intact pulses distally Dorsiflexion/Plantar flexion intact Incision: dressing C/D/I No cellulitis present Compartment soft  Dressing changed by me today  Assessment/Plan: 2 Days Post-Op Procedure(s) (LRB): TOTAL LEFT  KNEE ARTHROPLASTY (Left) Advance diet Up with therapy D/C IV fluids Discharge home with home health following PT session ABLA-mild and stable Dry dressing change prn WBAT LLE  Fannie Knee 08/03/2015, 8:46 AM

## 2015-08-03 NOTE — Progress Notes (Signed)
Physical Therapy Treatment Patient Details Name: Craig Hoover MRN: [id number] DOB: 06-11-1934 Today's Date: 08/03/2015    History of Present Illness Patient is a 81 y/o male s/p L TKA. PMH includes HTN, stroke, CKD, CAD.    PT Comments    Patient progressing well towards PT goals. Pt slightly impulsive and not always aware of safety during tasks resulting in LOB x2 when attempting to donn pants in standing. Reviewed exercises today. Improved ambulation distance with cues for safety. Will plan for stair training next session prior to discharge home. Will follow acutely.   Follow Up Recommendations  Home health PT;Supervision/Assistance - 24 hour     Equipment Recommendations  None recommended by PT    Recommendations for Other Services       Precautions / Restrictions Precautions Precautions: Knee Precaution Booklet Issued: Yes (comment) Precaution Comments: Reviewed no pillow under knee and precautions. Restrictions Weight Bearing Restrictions: Yes LLE Weight Bearing: Weight bearing as tolerated    Mobility  Bed Mobility Overal bed mobility: Needs Assistance Bed Mobility: Supine to Sit;Sit to Supine     Supine to sit: Modified independent (Device/Increase time) Sit to supine: Modified independent (Device/Increase time)   General bed mobility comments: HOB flat, no use of rails to simulate home environment.   Transfers Overall transfer level: Needs assistance Equipment used: Rolling walker (2 wheeled) Transfers: Sit to/from Stand Sit to Stand: Min guard         General transfer comment: Min guard for safety. pt with LOB x2, once back into chair when attempting to donn pants in standing.   Ambulation/Gait Ambulation/Gait assistance: Min guard Ambulation Distance (Feet): 200 Feet Assistive device: Rolling walker (2 wheeled) Gait Pattern/deviations: Step-to pattern;Step-through pattern;Decreased stance time - left;Decreased step length - right;Antalgic;Decreased  stride length   Gait velocity interpretation: <1.8 ft/sec, indicative of risk for recurrent falls General Gait Details: Cues for knee extension during stance phase to activate quadriceps. Cues for step through gait and heel strike for initial contact.    Stairs            Wheelchair Mobility    Modified Rankin (Stroke Patients Only)       Balance Overall balance assessment: Needs assistance Sitting-balance support: Feet supported;No upper extremity supported Sitting balance-Leahy Scale: Good     Standing balance support: During functional activity Standing balance-Leahy Scale: Fair Standing balance comment: Requires UE support and Min A during dynamic standing taks - donning pants ins tanding due to LOB x2.                     Cognition Arousal/Alertness: Awake/alert Behavior During Therapy: WFL for tasks assessed/performed Overall Cognitive Status: Within Functional Limits for tasks assessed                      Exercises Total Joint Exercises Ankle Circles/Pumps: Both;10 reps;Seated Quad Sets: Both;10 reps;Seated (3 sec hold) Short Arc Quad: Left;10 reps;Seated Heel Slides: Left;10 reps;Seated Straight Leg Raises: Left;10 reps;Seated Goniometric ROM: 7-80 degrees knee AROM    General Comments        Pertinent Vitals/Pain Pain Assessment: 0-10 Pain Score: 5  Pain Location: left knee Pain Descriptors / Indicators: Grimacing;Sore Pain Intervention(s): Monitored during session;Repositioned    Home Living                      Prior Function            PT Goals (current goals can  now be found in the care plan section) Progress towards PT goals: Progressing toward goals    Frequency  7X/week    PT Plan Current plan remains appropriate    Co-evaluation             End of Session Equipment Utilized During Treatment: Gait belt Activity Tolerance: Patient tolerated treatment well Patient left: in chair;with call  bell/phone within reach;with family/visitor present     Time: 3094-0768 PT Time Calculation (min) (ACUTE ONLY): 27 min  Charges:  $Gait Training: 8-22 mins $Therapeutic Exercise: 8-22 mins                    G Codes:      Shauna A Hartshorne 08/03/2015, 10:06 AM Wray Kearns, [place], DPT [phone]

## 2015-08-03 NOTE — Progress Notes (Signed)
Physical Therapy Treatment Patient Details Name: Craig Hoover MRN: [id number] DOB: [birth year] Today's Date: 08/03/2015    History of Present Illness Patient is a 81 y/o male s/p L TKA. PMH includes HTN, stroke, CKD, CAD.    PT Comments    Patient progressing well towards PT goals. Continues to require Min A for mobility due to balance deficits and impulsivity at times. Performed family training on stairs with daughter. Discussed car transfer technique. Pt safe to discharge from a mobility stand point with 24/7 S from family. Will follow acutely if pt still in hospital tomorrow.   Follow Up Recommendations  Home health PT;Supervision/Assistance - 24 hour     Equipment Recommendations  None recommended by PT    Recommendations for Other Services       Precautions / Restrictions Precautions Precautions: Knee Precaution Booklet Issued: Yes (comment) Precaution Comments: Reviewed no pillow under knee and precautions. Restrictions Weight Bearing Restrictions: Yes LLE Weight Bearing: Weight bearing as tolerated    Mobility  Bed Mobility Overal bed mobility: Needs Assistance Bed Mobility: Supine to Sit     Supine to sit: Modified independent (Device/Increase time)    General bed mobility comments: HOB flat, no use of rails to simulate home environment.   Transfers Overall transfer level: Needs assistance Equipment used: Rolling walker (2 wheeled) Transfers: Sit to/from Stand Sit to Stand: Min guard         General transfer comment: Min guard for safety. Unsteady upon standing at times due to impulsivity. transferred to chair post ambulation bout.  Ambulation/Gait Ambulation/Gait assistance: Min guard Ambulation Distance (Feet): 50 Feet (x2 bouts) Assistive device: Rolling walker (2 wheeled) Gait Pattern/deviations: Step-to pattern;Decreased stance time - left;Decreased step length - right;Decreased stride length   Gait velocity interpretation: <1.8 ft/sec,  indicative of risk for recurrent falls General Gait Details: Cues for knee extension during stance phase to activate quadriceps.    Stairs Stairs: Yes Stairs assistance: Min assist Stair Management: Backwards;With walker Number of Stairs: 2 General stair comments: Cues for technique. Performed x2. Daughter present for family training.   Wheelchair Mobility    Modified Rankin (Stroke Patients Only)       Balance Overall balance assessment: Needs assistance Sitting-balance support: Feet supported;No upper extremity supported Sitting balance-Leahy Scale: Good     Standing balance support: During functional activity Standing balance-Leahy Scale: Fair Standing balance comment: Requires Min A during hand washing due to unsteadiness.                     Cognition Arousal/Alertness: Awake/alert Behavior During Therapy: WFL for tasks assessed/performed Overall Cognitive Status: Within Functional Limits for tasks assessed                      Exercises Total Joint Exercises Ankle Circles/Pumps: Both;10 reps;Seated    General Comments        Pertinent Vitals/Pain Pain Assessment: 0-10 Pain Score: 7  Pain Location: left knee Pain Descriptors / Indicators: Sore Pain Intervention(s): Monitored during session;Repositioned;Patient requesting pain meds-RN notified    Home Living                      Prior Function            PT Goals (current goals can now be found in the care plan section) Progress towards PT goals: Progressing toward goals    Frequency  7X/week    PT Plan Current plan remains appropriate  Co-evaluation             End of Session Equipment Utilized During Treatment: Gait belt Activity Tolerance: Patient tolerated treatment well Patient left: in chair;with call bell/phone within reach;with family/visitor present;with nursing/sitter in room     Time: 1140-1200 PT Time Calculation (min) (ACUTE ONLY): 20  min  Charges:  $Gait Training: 8-22 mins                    G Codes:      Shauna A Hartshorne 08/03/2015, 12:14 PM Wray Kearns, [place], DPT [phone]

## 2015-08-03 NOTE — Progress Notes (Signed)
Patient provided discharge instruction and prescriptions.  No questions related to discharge.  Daughter present to drive patient home.

## 2015-08-03 NOTE — Care Management Note (Signed)
Case Management Note  Patient Details  Name: Craig Hoover MRN: [id number] Date of Birth: June 02, 1934  Subjective/Objective:            S/p left total knee arthroplasty        Action/Plan: Set up with [facility] for HHPT by MD office. Spoke with patent and his daughter, no change in discharge plan. Daughter stated that patient will have family with him 24/7. [employer] delivered CPM, rolling walker and 3N1 to home. OR recommended HHOT, contacted Katie at [facility] and set up [place].    Expected Discharge Date:                  Expected Discharge Plan:  [place]  In-House Referral:  NA  Discharge planning Services  CM Consult  Post Acute Care Choice:  Home Health, Durable Medical Equipment Choice offered to:  Patient  DME Arranged:  3-N-1, CPM, Walker rolling DME Agency:  [employer]  HH Arranged:  PT, OT [place] Agency:  [place]  Status of Service:  Completed, signed off  Medicare Important Message Given:    Date Medicare IM Given:    Medicare IM give by:    Date Additional Medicare IM Given:    Additional Medicare Important Message give by:     If discussed at [place] of Stay Meetings, dates discussed:    Additional Comments:  Nila Nephew, RN 08/03/2015, 10:18 AM

## 2015-11-22 ENCOUNTER — Other Ambulatory Visit (HOSPITAL_COMMUNITY): Payer: Medicare Other

## 2015-12-03 IMAGING — CR DG KNEE 1-2V PORT*L*
2 series · 2 of 2 positions shown · non-contrast
Comparison: None.

CLINICAL DATA: Postoperative for total knee replacement.

EXAM:
PORTABLE LEFT KNEE - 1-2 VIEW

[AP]
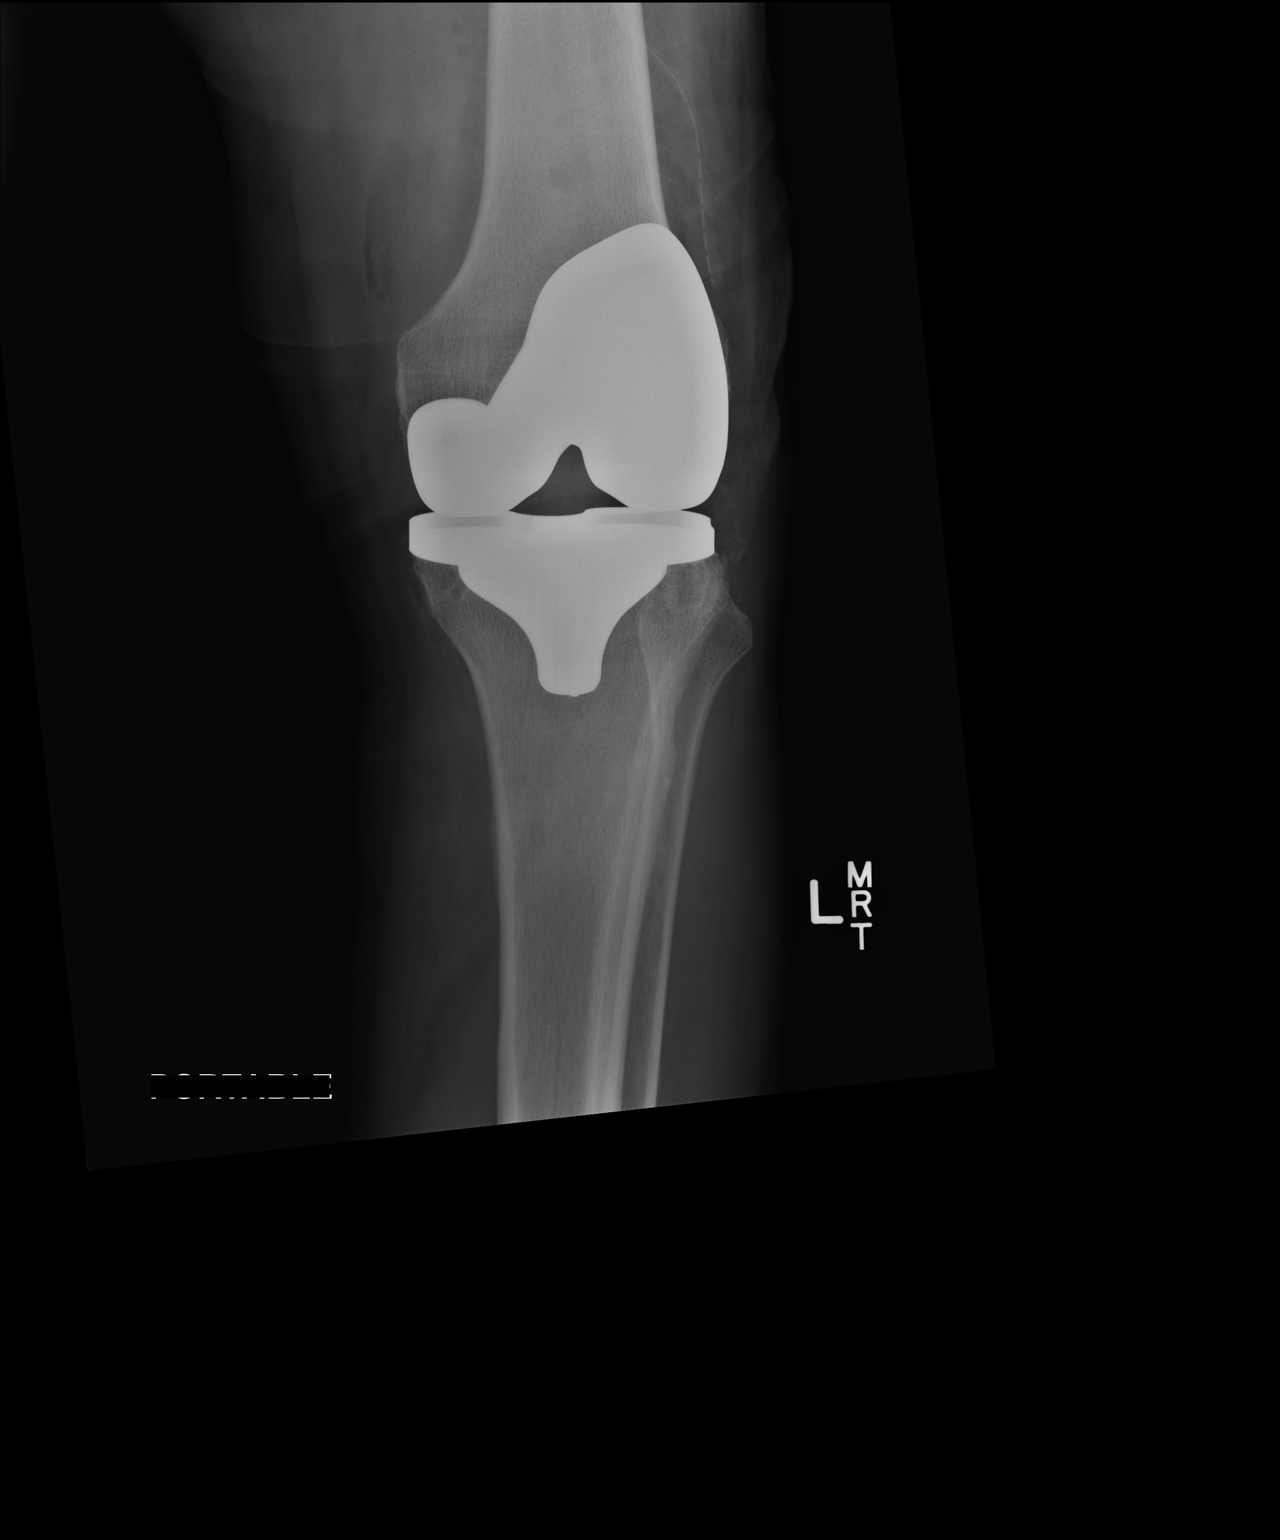

[xtable lateral]
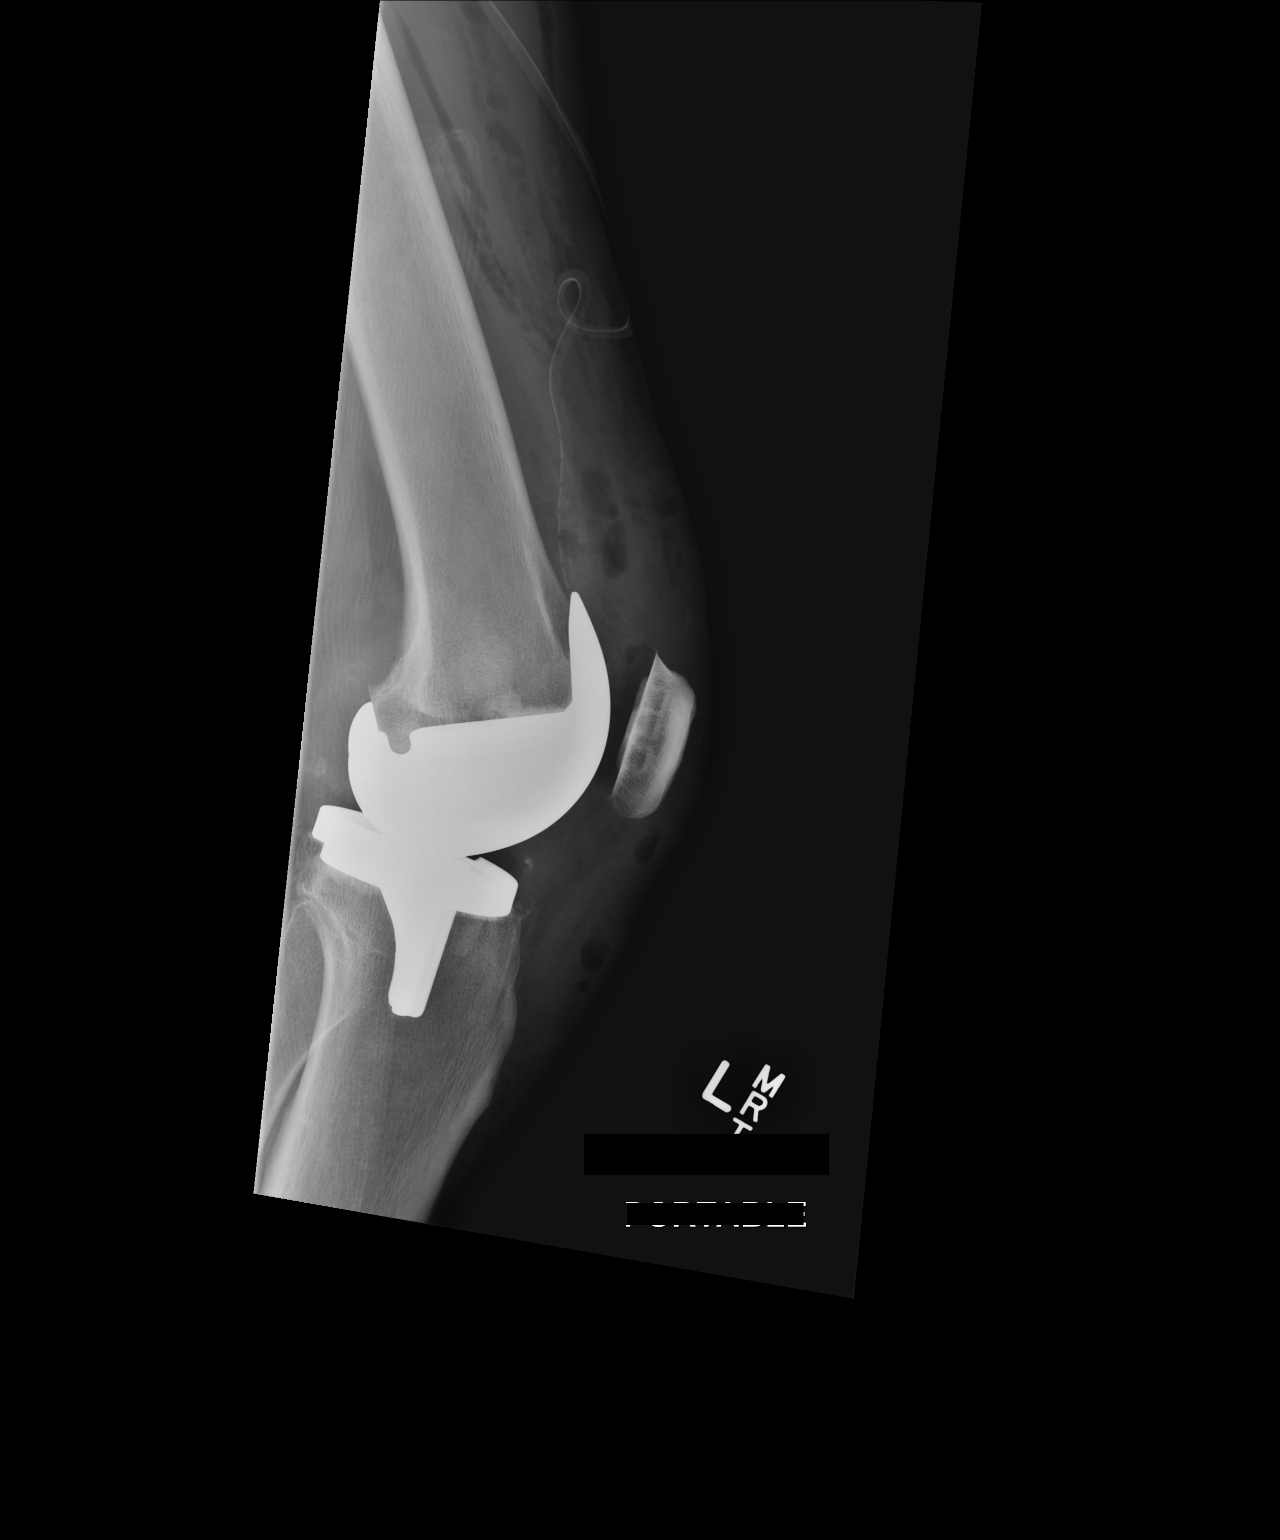

[2 of 2 positions shown; findings below may reference images not displayed]

FINDINGS: Left total knee prosthesis in place without periprosthetic fracture
or other immediate complicating feature. Expected appearance of gas
in the tissues and surgical drain.
IMPRESSION: 1. Total knee prosthesis in place without complicating feature.

## 2015-12-05 ENCOUNTER — Encounter (HOSPITAL_COMMUNITY): Admission: RE | Payer: Self-pay | Source: Ambulatory Visit

## 2015-12-05 ENCOUNTER — Inpatient Hospital Stay (HOSPITAL_COMMUNITY)
Admission: RE | Admit: 2015-12-05 | Payer: BLUE CROSS/BLUE SHIELD | Source: Ambulatory Visit | Admitting: Orthopedic Surgery

## 2015-12-05 SURGERY — ARTHROPLASTY, KNEE, TOTAL
Anesthesia: General | Laterality: Right
